# Patient Record
Sex: Female | Born: 1965 | Race: Black or African American | Hispanic: No | Marital: Married | Smoking: Never smoker
Health system: Southern US, Community
[De-identification: ages and names within clinical notes are randomized; demographics above are authoritative.]

## PROBLEM LIST (undated history)

## (undated) DIAGNOSIS — N912 Amenorrhea, unspecified: Secondary | ICD-10-CM

## (undated) DIAGNOSIS — N920 Excessive and frequent menstruation with regular cycle: Secondary | ICD-10-CM

## (undated) DIAGNOSIS — D649 Anemia, unspecified: Secondary | ICD-10-CM

## (undated) DIAGNOSIS — I10 Essential (primary) hypertension: Secondary | ICD-10-CM

## (undated) DIAGNOSIS — G43909 Migraine, unspecified, not intractable, without status migrainosus: Secondary | ICD-10-CM

## (undated) HISTORY — DX: Migraine, unspecified, not intractable, without status migrainosus: G43.909

## (undated) HISTORY — DX: Essential (primary) hypertension: I10

## (undated) HISTORY — DX: Amenorrhea, unspecified: N91.2

## (undated) HISTORY — DX: Excessive and frequent menstruation with regular cycle: N92.0

## (undated) HISTORY — DX: Anemia, unspecified: D64.9

---

## 2008-05-29 ENCOUNTER — Ambulatory Visit: Payer: Self-pay | Admitting: Internal Medicine

## 2008-06-06 ENCOUNTER — Emergency Department: Payer: Self-pay | Admitting: Emergency Medicine

## 2008-06-11 ENCOUNTER — Ambulatory Visit: Payer: Self-pay | Admitting: Internal Medicine

## 2008-06-29 ENCOUNTER — Ambulatory Visit: Payer: Self-pay | Admitting: Internal Medicine

## 2008-07-27 ENCOUNTER — Ambulatory Visit: Payer: Self-pay | Admitting: Internal Medicine

## 2008-08-27 ENCOUNTER — Ambulatory Visit: Payer: Self-pay | Admitting: Internal Medicine

## 2008-09-26 ENCOUNTER — Ambulatory Visit: Payer: Self-pay | Admitting: Internal Medicine

## 2008-10-27 ENCOUNTER — Ambulatory Visit: Payer: Self-pay | Admitting: Internal Medicine

## 2008-11-26 ENCOUNTER — Ambulatory Visit: Payer: Self-pay | Admitting: Internal Medicine

## 2008-12-27 ENCOUNTER — Ambulatory Visit: Payer: Self-pay | Admitting: Internal Medicine

## 2009-01-06 ENCOUNTER — Ambulatory Visit: Payer: Self-pay | Admitting: Internal Medicine

## 2009-01-27 ENCOUNTER — Ambulatory Visit: Payer: Self-pay | Admitting: Internal Medicine

## 2009-02-26 ENCOUNTER — Ambulatory Visit: Payer: Self-pay | Admitting: Internal Medicine

## 2009-03-17 ENCOUNTER — Ambulatory Visit: Payer: Self-pay | Admitting: Internal Medicine

## 2009-03-29 ENCOUNTER — Ambulatory Visit: Payer: Self-pay | Admitting: Internal Medicine

## 2009-04-28 ENCOUNTER — Ambulatory Visit: Payer: Self-pay | Admitting: Internal Medicine

## 2009-05-29 ENCOUNTER — Ambulatory Visit: Payer: Self-pay | Admitting: Internal Medicine

## 2009-05-29 HISTORY — PX: ENDOMETRIAL ABLATION: SHX621

## 2009-06-29 ENCOUNTER — Ambulatory Visit: Payer: Self-pay | Admitting: Internal Medicine

## 2009-07-15 ENCOUNTER — Ambulatory Visit: Payer: Self-pay | Admitting: Obstetrics & Gynecology

## 2009-07-16 ENCOUNTER — Encounter: Payer: Self-pay | Admitting: Obstetrics & Gynecology

## 2009-07-16 LAB — CONVERTED CEMR LAB
Clue Cells Wet Prep HPF POC: NONE SEEN
Trich, Wet Prep: NONE SEEN
WBC, Wet Prep HPF POC: NONE SEEN

## 2009-07-22 ENCOUNTER — Ambulatory Visit (HOSPITAL_COMMUNITY): Admission: RE | Admit: 2009-07-22 | Discharge: 2009-07-22 | Payer: Self-pay | Admitting: Obstetrics & Gynecology

## 2009-07-27 ENCOUNTER — Ambulatory Visit: Payer: Self-pay | Admitting: Internal Medicine

## 2009-08-02 ENCOUNTER — Ambulatory Visit: Payer: Self-pay | Admitting: Obstetrics & Gynecology

## 2009-08-16 ENCOUNTER — Ambulatory Visit (HOSPITAL_COMMUNITY): Admission: RE | Admit: 2009-08-16 | Discharge: 2009-08-16 | Payer: Self-pay | Admitting: Obstetrics & Gynecology

## 2009-08-16 ENCOUNTER — Ambulatory Visit: Payer: Self-pay | Admitting: Obstetrics & Gynecology

## 2009-08-27 ENCOUNTER — Ambulatory Visit: Payer: Self-pay | Admitting: Internal Medicine

## 2009-09-26 ENCOUNTER — Ambulatory Visit: Payer: Self-pay | Admitting: Internal Medicine

## 2009-09-27 ENCOUNTER — Ambulatory Visit: Payer: Self-pay | Admitting: Obstetrics & Gynecology

## 2009-09-27 LAB — CONVERTED CEMR LAB
HCT: 39 % (ref 36.0–46.0)
Hemoglobin: 11.3 g/dL — ABNORMAL LOW (ref 12.0–15.0)
MCHC: 29 g/dL — ABNORMAL LOW (ref 30.0–36.0)
MCV: 82.1 fL (ref 78.0–100.0)
Platelets: 367 10*3/uL (ref 150–400)
RBC: 4.75 M/uL (ref 3.87–5.11)
RDW: 17.4 % — ABNORMAL HIGH (ref 11.5–15.5)
TSH: 1.494 microintl units/mL (ref 0.350–4.500)
WBC: 4.7 10*3/uL (ref 4.0–10.5)

## 2009-10-27 ENCOUNTER — Ambulatory Visit: Payer: Self-pay | Admitting: Internal Medicine

## 2009-12-14 ENCOUNTER — Ambulatory Visit: Payer: Self-pay | Admitting: Gynecology

## 2009-12-14 ENCOUNTER — Inpatient Hospital Stay (HOSPITAL_COMMUNITY): Admission: AD | Admit: 2009-12-14 | Discharge: 2009-12-14 | Payer: Self-pay | Admitting: Obstetrics & Gynecology

## 2009-12-15 ENCOUNTER — Ambulatory Visit: Payer: Self-pay | Admitting: Obstetrics and Gynecology

## 2010-02-07 ENCOUNTER — Ambulatory Visit: Payer: Self-pay | Admitting: Obstetrics & Gynecology

## 2010-02-08 ENCOUNTER — Ambulatory Visit: Payer: Self-pay | Admitting: Obstetrics & Gynecology

## 2010-02-17 ENCOUNTER — Ambulatory Visit: Payer: Self-pay | Admitting: Obstetrics & Gynecology

## 2010-05-05 ENCOUNTER — Ambulatory Visit
Admission: RE | Admit: 2010-05-05 | Discharge: 2010-05-05 | Payer: Self-pay | Source: Home / Self Care | Attending: Obstetrics & Gynecology | Admitting: Obstetrics & Gynecology

## 2010-05-05 ENCOUNTER — Ambulatory Visit: Payer: Self-pay | Admitting: Obstetrics & Gynecology

## 2010-05-05 ENCOUNTER — Encounter: Payer: Self-pay | Admitting: Obstetrics & Gynecology

## 2010-05-11 ENCOUNTER — Ambulatory Visit: Payer: Self-pay | Admitting: Internal Medicine

## 2010-05-29 ENCOUNTER — Ambulatory Visit: Payer: Self-pay | Admitting: Internal Medicine

## 2010-05-29 HISTORY — PX: VAGINAL HYSTERECTOMY: SUR661

## 2010-05-29 HISTORY — PX: ENDOMETRIAL ABLATION: SHX621

## 2010-06-13 ENCOUNTER — Ambulatory Visit (HOSPITAL_COMMUNITY)
Admission: RE | Admit: 2010-06-13 | Discharge: 2010-06-14 | Payer: Self-pay | Source: Home / Self Care | Attending: Obstetrics & Gynecology | Admitting: Obstetrics & Gynecology

## 2010-06-13 ENCOUNTER — Encounter: Payer: Self-pay | Admitting: Obstetrics & Gynecology

## 2010-06-13 LAB — CBC
HCT: 40.5 % (ref 36.0–46.0)
Hemoglobin: 13.2 g/dL (ref 12.0–15.0)
MCH: 27.8 pg (ref 26.0–34.0)
MCHC: 32.6 g/dL (ref 30.0–36.0)
MCV: 85.4 fL (ref 78.0–100.0)
Platelets: 224 10*3/uL (ref 150–400)
RBC: 4.74 MIL/uL (ref 3.87–5.11)
RDW: 14.7 % (ref 11.5–15.5)
WBC: 5.9 10*3/uL (ref 4.0–10.5)

## 2010-06-13 LAB — SURGICAL PCR SCREEN
MRSA, PCR: NEGATIVE
Staphylococcus aureus: NEGATIVE

## 2010-06-15 LAB — CBC
HCT: 35.2 % — ABNORMAL LOW (ref 36.0–46.0)
Hemoglobin: 11.3 g/dL — ABNORMAL LOW (ref 12.0–15.0)
MCH: 27.4 pg (ref 26.0–34.0)
MCHC: 32.1 g/dL (ref 30.0–36.0)
MCV: 85.2 fL (ref 78.0–100.0)
Platelets: 260 10*3/uL (ref 150–400)
RBC: 4.13 MIL/uL (ref 3.87–5.11)
RDW: 14.5 % (ref 11.5–15.5)
WBC: 8.9 10*3/uL (ref 4.0–10.5)

## 2010-06-15 LAB — PREGNANCY, URINE: Preg Test, Ur: NEGATIVE

## 2010-06-23 NOTE — Op Note (Addendum)
Lori Levy, Lori Levy               ACCOUNT NO.:  [id number]  MEDICAL RECORD NO.:  [id number]          PATIENT TYPE:  OIB  LOCATION:  9312                          FACILITY:  [facility]  PHYSICIAN:  Allie Bossier, MD        DATE OF BIRTH:  05/23/1966  DATE OF PROCEDURE: DATE OF DISCHARGE:                              OPERATIVE REPORT   PREOPERATIVE DIAGNOSES:  Symptomatic uterine fibroids, menorrhagia, obesity.  POSTOPERATIVE DIAGNOSES:  Symptomatic uterine fibroids, menorrhagia, obesity, left ovarian simple cyst.  PROCEDURE:  Total vaginal hysterectomy, left ovarian cystotomy.  SURGEON:  Allie Bossier, MD  ASSISTANT:  Lesly Dukes, M.D.  ANESTHESIA:  GETA, Quillian Quince, M.D.  ESTIMATED BLOOD LOSS:  150 mL.  SPECIMENS:  Uterus with fibroids.  COMPLICATIONS:  None.  FINDINGS:  Fibroid uterus, normal-appearing ovaries.  DETAIL PROCEDURE AND FINDINGS:  The risks, benefits, and alternatives of surgery were explained, understood, and accepted.  She understands the risks including but not limited to infection, bleeding, damage to bowel, bladder, and ureters and deep vein thrombosis.  She wishes to proceed. Consents were signed.  All questions were answered.  In the operating room, her Ancef was given preoperatively.  She was placed in dorsal lithotomy position.  General anesthesia was applied without complication.  Her vagina and lower abdomen were prepped and draped in usual sterile fashion.  Her bladder was emptied with catheter.  Bimanual exam revealed an 8-week sized anteverted, mobile uterus with fibroids. Her adnexa were not enlarged.  A weighted speculum was placed posteriorly and a Deaver anteriorly.  Cervix was grasped with single- tooth tenaculum.  I created solution composed of 30 mL of 0.5% Marcaine with dilute epinephrine with 100 mL of injectable saline.  I used 80 mL of this solution to injecting it into the cervicovaginal junction for hydrodissection purposes.   I then made an incision with a #10 blade at the same site.  I entered the posterior peritoneum posteriorly and replaced the weighted speculum with a long weighted speculum.  I identified the anterior peritoneum and entered it sharply.  I then tagged and held it for further use.  The uterosacral ligament were clamped, cut, and ligated.  These were tagged and held for future use. Please note that 2-0 Vicryl suture was used throughout this case unless otherwise specified.  The uterine vessels were clamped, cut, and ligated.  Excellent hemostasis was noted.  At this point, I removed a posterior section of the uterus along with the fibroid to allow better visualization.  At this point, I was able to clamp, cut, and ligate the utero-ovarian vessels on each side.  Excellent hemostasis was noted. The uterus was removed and noted to be normal with the exception of the fibroids.  The ovary on the right appeared normal.  The one in the left had a 2-3 cm simple cyst.  I opened it with the Bovie and cauterized the edges.  Excellent hemostasis was noted.  I then placed a sponge on a stick in the vagina to keep the bowel back out of the operative site, and I closed the peritoneum  in a pursestring fashion with 2-0 Vicryl suture after assuring hemostasis of all pedicles.  I then used the sutures on the tagged uterosacral ligament to create angle sutures at each side of the vaginal cuff.  I then closed the vaginal cuff with a 0 Vicryl running locking suture in a vertical fashion.  Excellent hemostasis was noted.  Her Foley catheter drained clear urine.  She tolerated the procedure well.  Please note at the beginning of the case, the Foley catheter that had been placed was a latex free one and it was not in a position where I could clamp it easily, so I removed it and later replaced it with a normal Foley catheter.  Clear urine was noted. The instruments, sponge, and needle counts were correct.  She  tolerated the procedure well.  She was extubated and taken to recovery room in stable condition.     Allie Bossier, MD     MCD/MEDQ  D:  06/13/2010  T:  06/14/2010  Job:  [id number]  Electronically Signed by Nicholaus Bloom MD on 06/23/2010 03:48:55 PM

## 2010-06-23 NOTE — Discharge Summary (Addendum)
  Lori Levy, Lori Levy               ACCOUNT NO.:  [id number]  MEDICAL RECORD NO.:  [id number]          PATIENT TYPE:  OIB  LOCATION:  9312                          FACILITY:  [facility]  PHYSICIAN:  Allie Bossier, MD        DATE OF BIRTH:  [birth year]  DATE OF ADMISSION:  06/13/2010 DATE OF DISCHARGE:  06/14/2010                              DISCHARGE SUMMARY   ADMISSION DIAGNOSES: 1. Symptomatic fibroids. 2. Menorrhagia. 3. Dysfunctional uterine bleeding.  DISCHARGE DIAGNOSES: 1. Symptomatic fibroids. 2. Menorrhagia. 3. Dysfunctional uterine bleeding.  CONDITION:  Stable.  DISPOSITION:  Home.  DIET:  As tolerated.  Follow up in 3 weeks at the [facility] office or p.r.n. sooner.  MEDICATIONS: 1. Percocet 325/5 mg 1 p.o. q.4 hours p.r.n. pain, #30, no refills. 2. Ibuprofen 800 mg 1 p.o. q.8 hours p.r.n. 3. She is to discontinue her home med of Provera and it is okay with     me if she discontinue her folic acid that she was taking at home.  ACTIVITY:  She is not to drive a car while she is taking narcotics nor she did have anything in her vagina until 6 weeks postop.  BRIEF HISTORY OF PRESENT ILLNESS AND HOSPITAL COURSE:  Lori Levy is a 45 year old lady who has had symptomatic fibroids with menorrhagia, dysfunctional uterine bleeding.  Her hemoglobin got as low as 5 mg.  She had an endometrial ablation almost a year ago that she says has not helped.  Please note that her hemoglobin is currently 13.5.  She does have a fibroid on ultrasound and she wishes to have a hysterectomy, I thought the vaginal route was best.  She underwent an uncomplicated TVH with an EBL of 150 mL. Postoperatively, she has remained afebrile.  She is ambulating, tolerating p.o.  She is voiding and having flatus.  Her physical exam was within normal limits and her vital signs were stable.  She was afebrile and was discharged home and she will follow up as above.     Allie Bossier,  MD     MCD/MEDQ  D:  06/14/2010  T:  06/15/2010  Job:  [id number]  Electronically Signed by Nicholaus Bloom MD on 06/23/2010 03:48:52 PM

## 2010-06-28 ENCOUNTER — Ambulatory Visit
Admission: RE | Admit: 2010-06-28 | Discharge: 2010-06-28 | Payer: Self-pay | Source: Home / Self Care | Attending: Obstetrics & Gynecology | Admitting: Obstetrics & Gynecology

## 2010-06-29 NOTE — Assessment & Plan Note (Signed)
NAME:  MALON, SIDDALL NO.:  [id number]  MEDICAL RECORD NO.:  [id number]          PATIENT TYPE:  POB  LOCATION:  [facility] at [facility]         FACILITY:  [facility]  PHYSICIAN:  Allie Bossier, MD        DATE OF BIRTH:  16-Aug-1965  DATE OF SERVICE:  06/28/2010                                 CLINIC NOTE  Ms. Bovard is a 45 year old lady who underwent an uncomplicated TVH and left ovarian cystotomy on June 13, 2010.  She is now 2 weeks postop. She has no problems.  Her bowels are functioning normally.  Her bladder is functioning normally.  She has not had intercourse.  She has quit taking her Percocet and merely taking ibuprofen.  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  On exam, her abdomen is benign.  Her vaginal cuff is healing well.  ASSESSMENT/PLAN:  Postop 2 weeks, doing very well.  She said that she is fine to return to work but she knows not to have any intercourse until I see her back for her 6-week visit.     Allie Bossier, MD    MCD/MEDQ  D:  06/28/2010  T:  06/29/2010  Job:  [id number]

## 2010-07-21 IMAGING — US ABDOMEN ULTRASOUND
1 series · 17 of 25 positions shown · non-contrast
Comparison: none

REASON FOR EXAM: Vomiting, elevated LFT's
COMMENTS:

[Series 1: abdomen ultrasound · 17 of 61 slices shown]
[im 1/61]
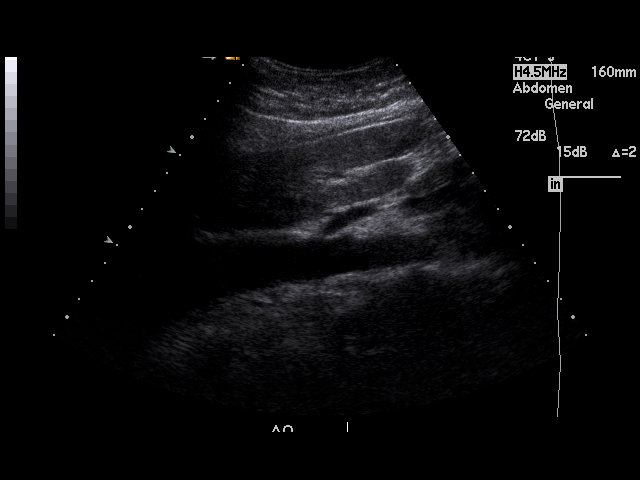
[im 6/61]
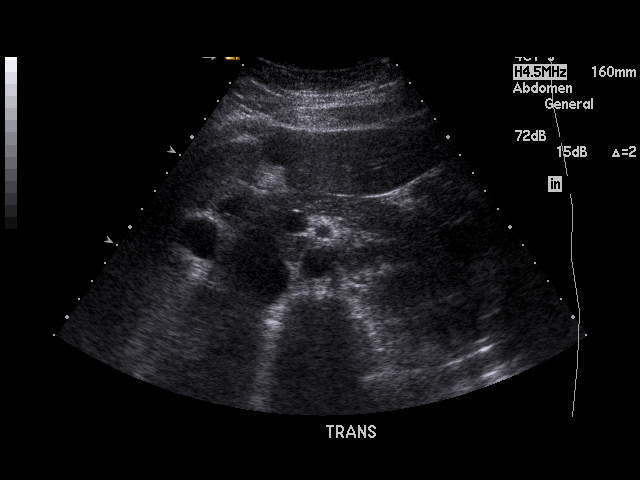
[im 8/61]
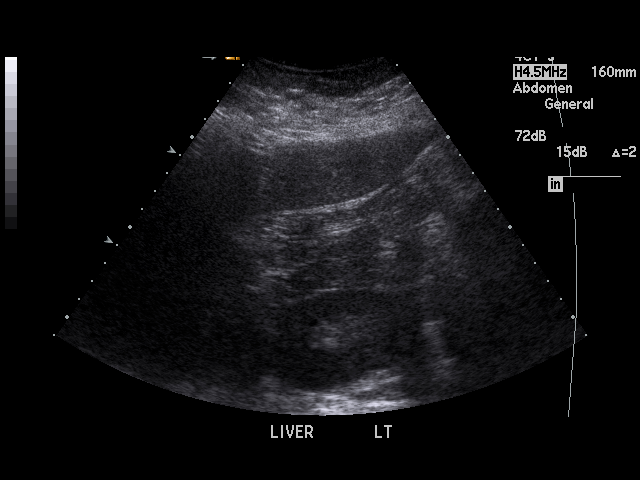
[im 13/61]
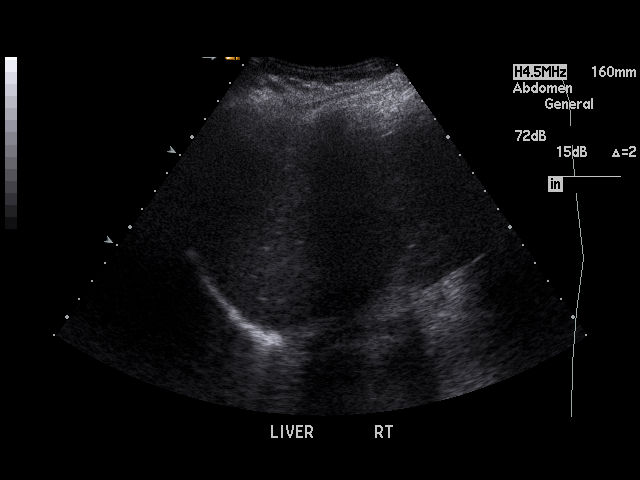
[im 16/61]
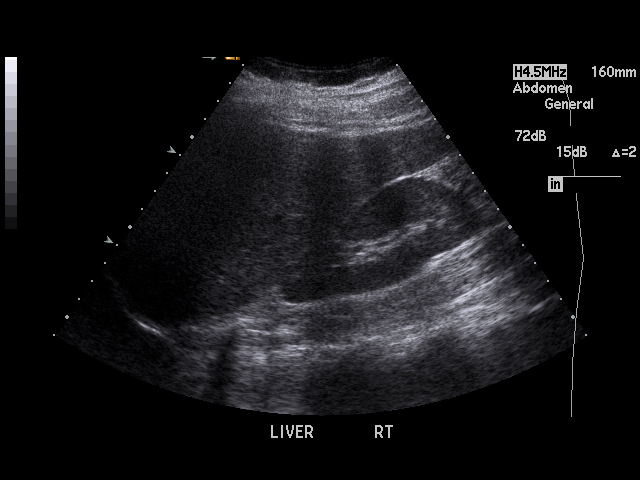
[im 21/61]
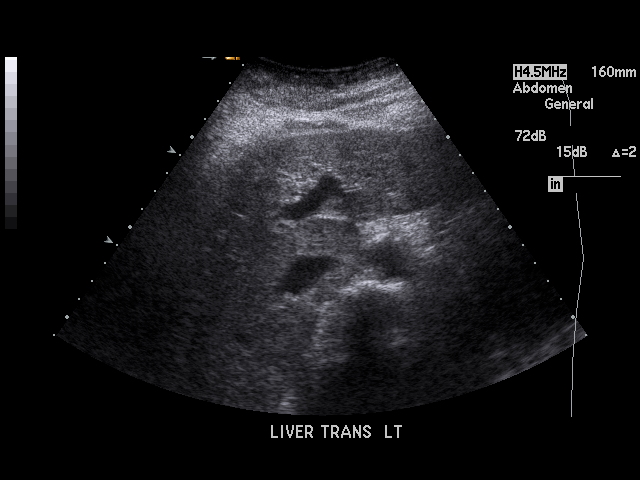
[im 23/61]
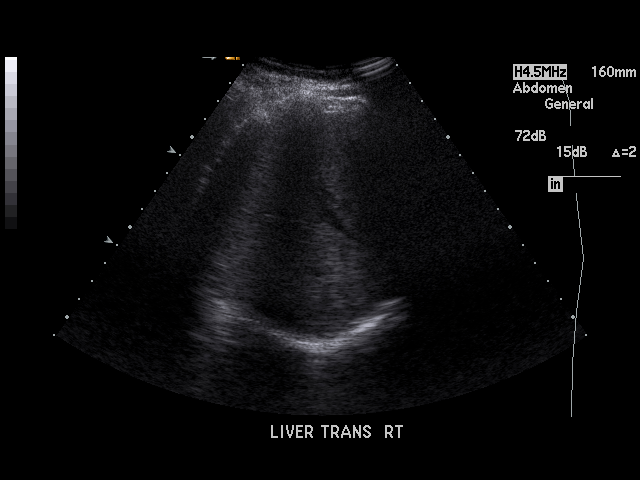
[im 28/61]
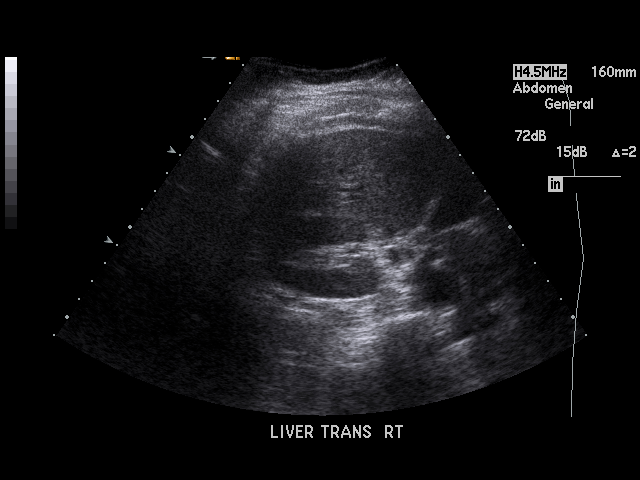
[im 31/61]
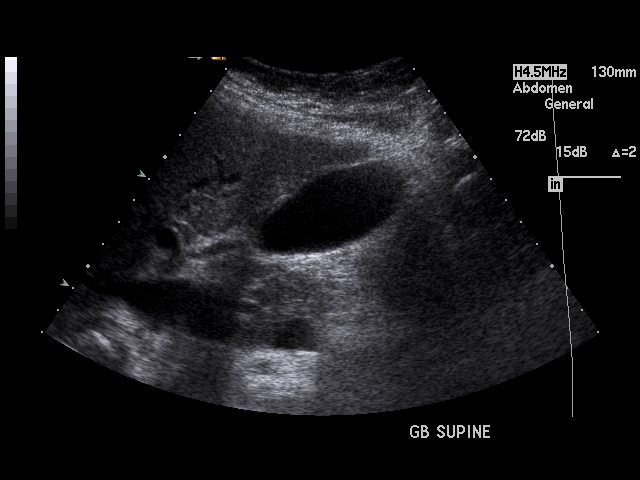
[im 33/61]
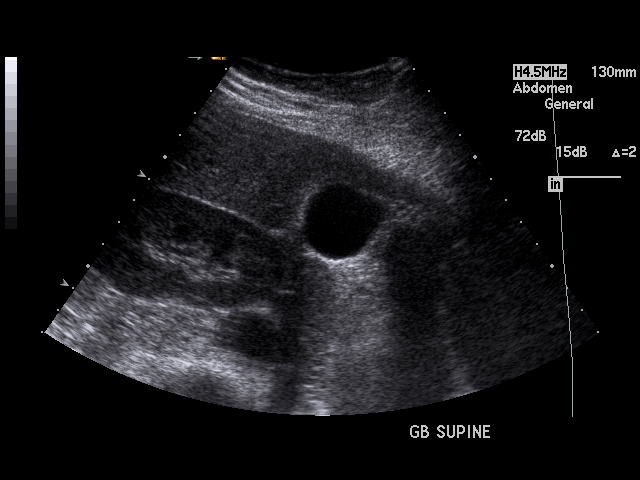
[im 38/61]
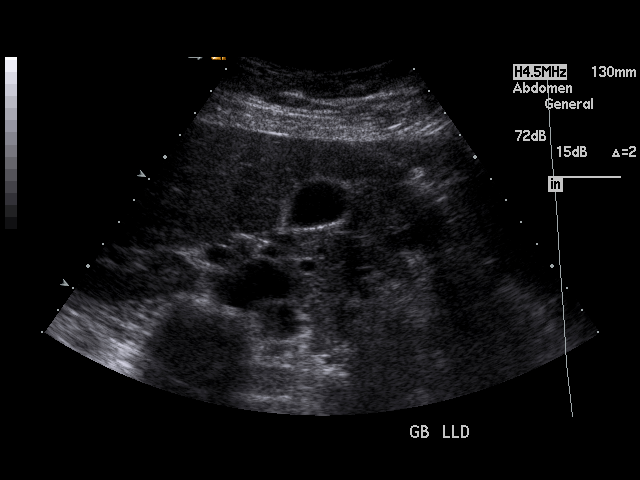
[im 41/61]
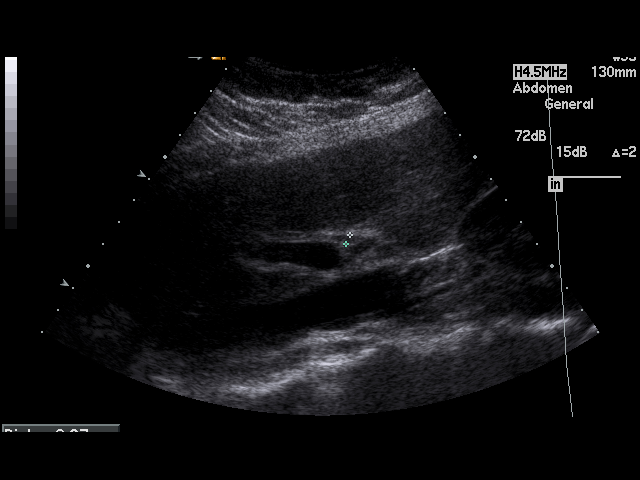
[im 46/61]
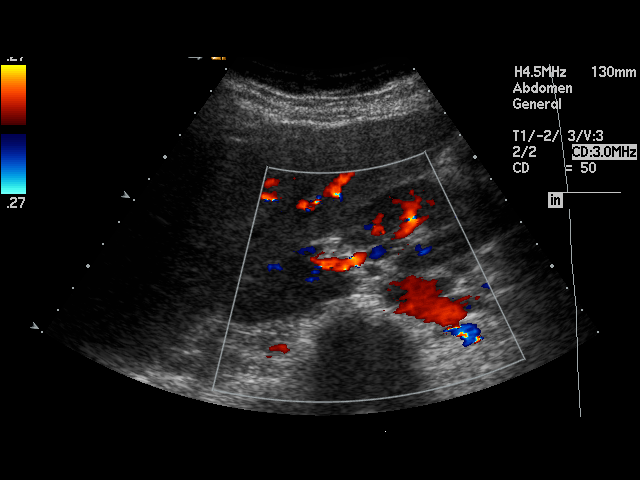
[im 48/61]
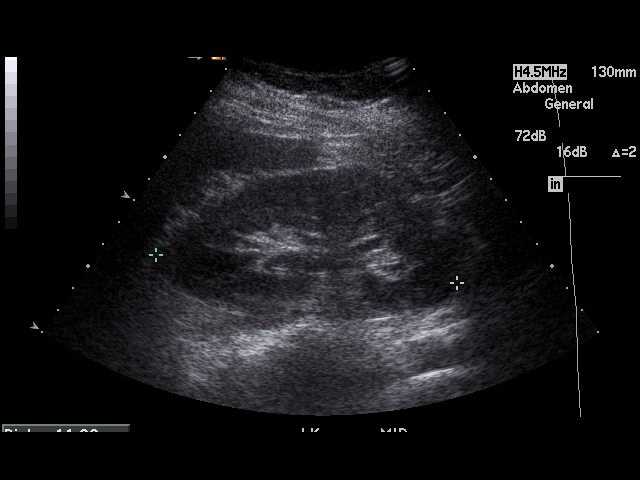
[im 53/61]
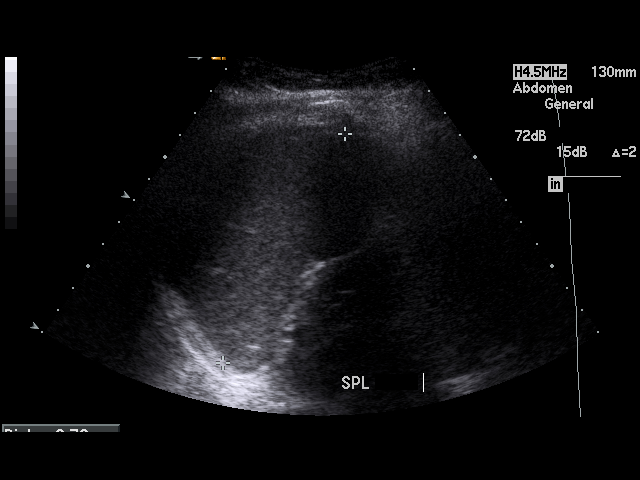
[im 56/61]
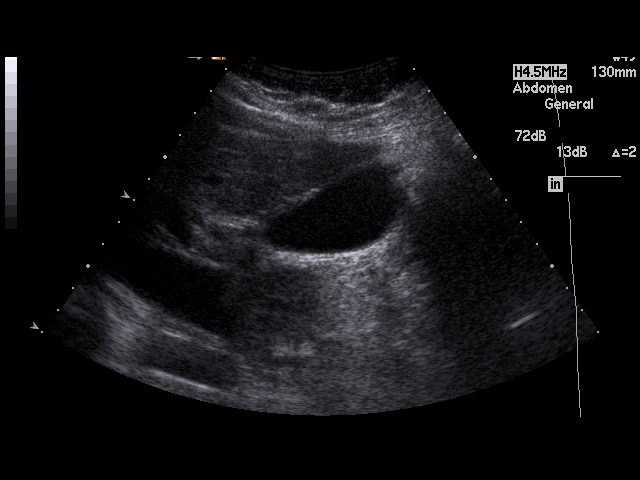
[im 61/61]
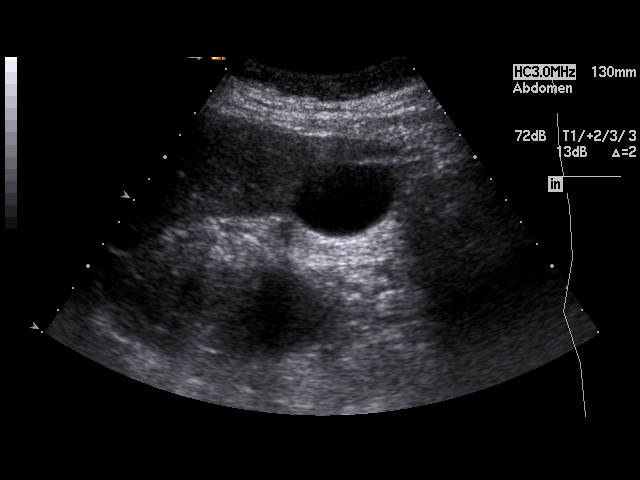

[17 of 25 positions shown; findings below may reference images not displayed]

PROCEDURE:     US  - US ABDOMEN GENERAL SURVEY  - June 06, 2008  [DATE]

RESULT:     Ultrasound of the abdomen demonstrates a normal appearance of
the abdominal aorta. The pancreas is unremarkable. The gallbladder is fluid
filled with no stones. The common bile duct diameter is 3.9 mm. The
gallbladder wall thickness is 1.9 mm. The liver, portal venous flow, kidneys
and spleen appear normal. There is no ascites.
IMPRESSION: Normal appearing Abdominal Ultrasound.

## 2010-07-26 ENCOUNTER — Ambulatory Visit: Payer: BC Managed Care – PPO | Admitting: Obstetrics and Gynecology

## 2010-07-26 DIAGNOSIS — Z09 Encounter for follow-up examination after completed treatment for conditions other than malignant neoplasm: Secondary | ICD-10-CM

## 2010-08-13 LAB — DIFFERENTIAL
Basophils Absolute: 0 10*3/uL (ref 0.0–0.1)
Basophils Relative: 0 % (ref 0–1)
Eosinophils Absolute: 0.1 10*3/uL (ref 0.0–0.7)
Eosinophils Relative: 1 % (ref 0–5)
Lymphocytes Relative: 24 % (ref 12–46)
Lymphs Abs: 1.5 10*3/uL (ref 0.7–4.0)
Monocytes Absolute: 0.6 10*3/uL (ref 0.1–1.0)
Monocytes Relative: 10 % (ref 3–12)
Neutro Abs: 4.1 10*3/uL (ref 1.7–7.7)
Neutrophils Relative %: 65 % (ref 43–77)

## 2010-08-13 LAB — CBC
HCT: 38 % (ref 36.0–46.0)
Hemoglobin: 12.1 g/dL (ref 12.0–15.0)
MCH: 27.1 pg (ref 26.0–34.0)
MCHC: 32 g/dL (ref 30.0–36.0)
MCV: 84.5 fL (ref 78.0–100.0)
Platelets: 188 10*3/uL (ref 150–400)
RBC: 4.49 MIL/uL (ref 3.87–5.11)
RDW: 17.5 % — ABNORMAL HIGH (ref 11.5–15.5)
WBC: 6.4 10*3/uL (ref 4.0–10.5)

## 2010-08-19 NOTE — Assessment & Plan Note (Signed)
NAME:  NIKESHA, KWASNY NO.:  [id number]  MEDICAL RECORD NO.:  [id number]           PATIENT TYPE:  LOCATION:  [facility] at [facility]           FACILITY:  PHYSICIAN:  Allie Bossier, MD        DATE OF BIRTH:  Jul 28, 1965  DATE OF SERVICE:  07/26/2010                                 CLINIC NOTE  Ms. Tramontana is a 45 year old lady who is now status post TVH, right ovarian cystotomy 6 weeks ago.  She has had no problems and her bowel and bladder function are entirely normal.  She is not taking any pain medicines.  She has not had sexual intercourse since surgery.  She would like to return to work without restrictions.  Physical exam, her vaginal cuff is well-healed.  Bimanual exam reveals no masses or tenderness.  ASSESSMENT/PLAN:  Postop 6 weeks doing well.  Please note her pathology was benign for fibroids, adenomyosis but no malignancy.  I have asked her to return to the office in 12 months for an annual exam or sooner if necessary.     Allie Bossier, MD    MCD/MEDQ  D:  07/26/2010  T:  07/26/2010  Job:  [id number]

## 2010-08-22 LAB — CBC
HCT: 38 % (ref 36.0–46.0)
Hemoglobin: 11.8 g/dL — ABNORMAL LOW (ref 12.0–15.0)
MCHC: 31.1 g/dL (ref 30.0–36.0)
MCV: 82.6 fL (ref 78.0–100.0)
Platelets: 227 10*3/uL (ref 150–400)
RBC: 4.6 MIL/uL (ref 3.87–5.11)
RDW: 18.7 % — ABNORMAL HIGH (ref 11.5–15.5)
WBC: 5.2 10*3/uL (ref 4.0–10.5)

## 2010-08-22 LAB — PREGNANCY, URINE: Preg Test, Ur: NEGATIVE

## 2010-10-11 NOTE — Assessment & Plan Note (Signed)
NAME:  Lori, Levy NO.:  [id number]   MEDICAL RECORD NO.:  [id number]          PATIENT TYPE:  POB   LOCATION:  [facility] at [facility]         FACILITY:  [facility]   PHYSICIAN:  Allie Bossier, MD        DATE OF BIRTH:  21-Jun-1965   DATE OF SERVICE:  07/15/2009                                  CLINIC NOTE   Ms. Lori Levy is a 45 year old married black gravida 3, para 3.  She has  children ages 19, 45, and 88.  They all are outside the home.  She comes  here because she has a long history of chronic anemia with her  hemoglobin majoring at 5.0 several months ago.  She has not required any  transfusions, but her hematologist gives her IV iron at times and it  seems that the direct cause of her bleeding is her heavy periods.  She  bleeds for about 8 days each month and it is very heavy.  She describes  she also has headaches with her periods.  She had a sudden ultrasound  several years ago that she describes as normal, at [facility] but  no ultrasound recently.  She says her TSH was checked recently by Dr.  Kerin Salen, her hematologist, who says that was normal.  She has been tried  on Mirena (over a year), oral contraceptive pills, all to no avail.   REVIEW OF SYSTEMS:  She is married for 23 years.  Her husband is status  post vasectomy.  She is a Veterinary surgeon at [employer].  She has not had  any transfusions, just looks chronically low.  Her last mammogram and  Pap smear were done in 2009.   FAMILY HISTORY:  Negative for breast, GYN and colon malignancies.   ALLERGIES TO MEDICINES:  LATEX.   SOCIAL HISTORY:  She denies tobacco or drug abuse.  She does drink  socially.   MEDICATIONS:  She takes folic acid.  She takes IV iron periodically.  She takes Percocet for headaches on a p.r.n. basis and she says she  takes the Diflucan and over-the-counter yeast medications after her  periods for a presumed yeast infection.   Physical exam; well-nourished, well-hydrated  pleasant white female,  height 5 feet 5-1/2 inches, weight 187, blood pressure 118/84, pulse 79.  HEENT, normal breast exam.  She does have fibrocystic changes,  especially just below the nipple on each side.  These are symmetrical.  She says they get much bigger during her period, but they regress in  size at the completion of her periods.  She does her self-breast exams.  Abdomen; obese, benign.  External genitalia, no lesions.  Cervix parous.  She has a pubic arch that would allow easy vaginal approach for  hysterectomy.  Uterus is upper limits of normal size.  It is mobile and  anteverted.  Her adnexa are nonenlarged and nontender.   ASSESSMENT AND PLAN:  1. Annual exam.  I have checked Pap smear and we will schedule a      mammogram.  Recommended self-breast and self-vulvar exams.  2. Menorrhagia with resulting chronic anemia.  I will await her  ultrasound result.  I have discussed endometrial ablation as well      as a vaginal hysterectomy.  I told her that we should get the      results of her ultrasound prior to      making decisions with regard to her possible yeast infections after      period.  I have checked a wet prep today and we will follow up on      that.      Allie Bossier, MD     MCD/MEDQ  D:  07/15/2009  T:  07/15/2009  Job:  [id number]

## 2010-10-11 NOTE — Assessment & Plan Note (Signed)
NAME:  Lori Levy, Lori Levy NO.:  [id number]   MEDICAL RECORD NO.:  [id number]          PATIENT TYPE:  POB   LOCATION:  [facility] at [facility]         FACILITY:  [facility]   PHYSICIAN:  Jaynie Collins, MD     DATE OF BIRTH:  [birth year]   DATE OF SERVICE:  05/05/2010                                  CLINIC NOTE   REASON FOR VISIT:  Left lower extremity cramping and oral contraceptive  pill surveillance.   The patient is a 45 year old gravida 3, para 3 who is on oral  contraceptive pills for menorrhagia.  The patient had an endometrial  ablation in March 2011 but has continued to have bleeding and was  counseled regarding her being on birth control pills versus Mirena  versus surgery.  The patient opted to be on Beyaz but did schedule a  vaginal hysterectomy on January 16.  She has been on different oral  contraceptive pills over the years; however, she has noted since the  last 1 or 2 weeks that she has had increasingly worsening left lower  extremity cramping.  The patient says that this is worse at night and  she denies any right lower extremity symptoms.  The patient is very  concerned and wants further evaluation.   PHYSICAL EXAMINATION:  VITAL SIGNS:  Blood pressure is 124/90, pulse 88,  weight 187 pounds, height 5 feet 6-1/2 inches.  GENERAL:  No apparent distress.  LUNGS:  Clear to auscultation bilaterally.  EXTREMITIES:  The patient does have some tenderness on palpation of her  left calf area, but no cords are palpated.  She does have a positive  Homans' sign, but is very tender no matter where you touch on her left  lower extremity.  There is no edema or cyanosis or clubbing, and her  calf measures about 40 cm in the largest dimension.  On evaluation of  her right leg, there is no tenderness elicited on examination.  Negative  Homans' sign.  No cords palpated and her calf measured the same, which  is about 40 cm in the largest dimension.  No edema bilaterally.   IMPRESSION:  The patient on oral contraceptive pills, now with left  lower extremity cramping and pain.   PLAN:  Given that the patient is on oral contraceptive pills, especially  a pill that is containing drospirenone (Beyaz), the patient will be  scheduled for bilateral lower extremity Dopplers, especially evaluation  of her left lower extremity Doppler to rule out DVT.  In the meantime,  she was told to discontinue the [facility] until further notice and if she  does start to have some withdrawal bleeding prior to surgery, she was  given a prescription for Provera 10 mg twice a day to use.  We will  follow up on the results of her lower extremity Dopplers and manage  accordingly.           ______________________________  Jaynie Collins, MD     UA/MEDQ  D:  05/05/2010  T:  05/05/2010  Job:  [id number]

## 2010-10-11 NOTE — Assessment & Plan Note (Signed)
NAME:  Lori Levy, IANNACCONE NO.:  [id number]   MEDICAL RECORD NO.:  [id number]          PATIENT TYPE:  POB   LOCATION:  [facility] at [facility]         FACILITY:  [facility]   PHYSICIAN:  Allie Bossier, MD        DATE OF BIRTH:  15-May-1966   DATE OF SERVICE:  09/27/2009                                  CLINIC NOTE   Ms. Emilee Hero is a 45 year old married black gravida 3, para 3 who is now 5-  1/2 weeks postop status post HTA, hydrothermal ablation and D and C.  Her pathology showed normal endometrium.  She reports that she had 3  days worth of spotting last week and was associated with dysmenorrhea  and some bloating.  The bloating and pain is now resolved.  She tried  her prescription diclofenac for relief of the pain, but it did not help  much.  On exam, her cervix shows no abnormalities or bleeding.   ASSESSMENT AND PLAN:  1. 5 and 1/2 weeks status post hydrothermal ablation and dilation and      curettage.  Reassurance given.  2. A 20-pound weight gain in the last year.  I will check TSH today.  3. Her primary doctor still encourages just to take iron and would      like her hemoglobin to be at a steady 13 before she would      recommend discontinuing her iron, so I am checking a CBC today.      Allie Bossier, MD     MCD/MEDQ  D:  09/27/2009  T:  09/28/2009  Job:  [id number]

## 2010-10-11 NOTE — Assessment & Plan Note (Signed)
NAME:  AMYJO, MIZRACHI NO.:  [id number]   MEDICAL RECORD NO.:  [id number]          PATIENT TYPE:  POB   LOCATION:  [facility] at [facility]         FACILITY:  [facility]   PHYSICIAN:  Allie Bossier, MD        DATE OF BIRTH:  1965/06/10   DATE OF SERVICE:  02/08/2010                                  CLINIC NOTE   Ms. Paul is a 45 year old married black G3, P3 who is status post  endometrial ablation in March 2011.  She was seen in the MAU in December 14, 2009, for heavy vaginal bleeding.  At that time, her hemoglobin was 12.  Please note that preoperative it with 7; however, she says she had a  heavy period in July, she had another heavy period in August, and then  she just started another period this month.  Please note, the ultrasound  was done in July showed that her 2 cm fibroid is now 2.6 cm and her  endometrial lining is thin.  Her ovaries appeared normal.  I discussed  with her medical options including a pill and Mirena versus surgical  options including a vaginal hysterectomy.  She would like to have a  vaginal hysterectomy but cannot do this until December; therefore, I  have given her samples of 2 months of Beyaz and hopefully, this will  decrease her periods enough that she will be satisfied until the time  when her hysterectomy can be performed.  In fact, if the birth control  pills control her symptoms adequately, she would not have to have a  hysterectomy at all.  She will come back in next week or 2 for blood  pressure check or sooner as necessary.      Allie Bossier, MD     MCD/MEDQ  D:  02/08/2010  T:  02/09/2010  Job:  [id number]

## 2010-10-11 NOTE — Assessment & Plan Note (Signed)
NAME:  JANECIA, PALAU NO.:  [id number]   MEDICAL RECORD NO.:  [id number]          PATIENT TYPE:  POB   LOCATION:  [facility] at [facility]         FACILITY:  [facility]   PHYSICIAN:  Allie Bossier, MD        DATE OF BIRTH:  Dec 29, 1965   DATE OF SERVICE:  08/02/2009                                  CLINIC NOTE   Ms. Brinkley is a 45 year old married black, gravida 3, para 3 with a  history of significant menorrhagia with resulting chronic anemia that  requires IV iron transfusions for the last 2 years.  An ultrasound that  I ordered last week shows 2 uterine fibroids, the largest measuring 3.2  cm has a submucosal component.  I discussed treatment options including  a endometrial ablation and a vaginal hysterectomy.  She opts to proceed  with the ablation.  I will schedule an ablation and D and C to be done  in the very near future.  CBC will be checked prior to surgery.      Allie Bossier, MD     MCD/MEDQ  D:  08/02/2009  T:  08/03/2009  Job:  [id number]

## 2010-10-11 NOTE — Assessment & Plan Note (Signed)
Lori Levy, Lori Levy NO.:  [id number]   MEDICAL RECORD NO.:  [id number]          PATIENT TYPE:  POB   LOCATION:  [facility] at [facility]         FACILITY:  [facility]   PHYSICIAN:  Catalina Antigua, MD     DATE OF BIRTH:  05-Nov-1965   DATE OF SERVICE:  12/15/2009                                  CLINIC NOTE   This is a 45 year old G3, P3, who is status post endometrial ablation in  March 2011, who comes today as a MAU followup for heavy vaginal  bleeding.  The patient states that on Monday, she started experiencing  some cramping pain, a little bit more than she is used to, but she was  able to tolerate it; but yesterday around lunchtime, she started  experiencing some heavy cramping and noted some heavy vaginal bleeding.  The patient states that she soaked through 6 pads from noon until her  presentation to the MAU around 5 and another 6 pads while she was there  at MAU during her 3-hour stay.  The patient states that today her  bleeding is minimal, rarely spotting.  She denies any cramping pain at  this point.  Denies any chest pain, shortness of breath,  lightheadedness, dizziness, or palpitations.   PHYSICAL EXAMINATION:  VITAL SIGNS:  Her blood pressure is 126/80, pulse  of 87, weight of 188 pounds, height of 65-1/2 inches.  ABDOMEN:  Soft, nontender, nondistended.  PELVIC:  She had minimal bleeding.   An ultrasound that was performed on December 14, 2009, in the MAU was  essentially unchanged from her February 2011 ultrasound which  demonstrated an uterus measuring 10 x 7 x 6 cm with an endometrial  stripe of 0.8 cm, normal right and left ovaries, and she has a small  left fibroid, measuring 2 x 2 x 2 cm.   ASSESSMENT AND PLAN:  This is a 45 year old G3, P3, who is status post  endometrial ablation in March 2011, who presents status post MAU  evaluation for heavy vaginal bleeding.  The patient was advised that it  was expected to experience some abnormal bleeding  status post an  ablation and since this is a one-time occurrence since the procedure, no  further monitoring is recommended.  Discussion of future interventions  were reviewed which included initiation of hormonal birth control or  Mirena IUD versus repeat endometrial ablation versus definitive surgical  treatment with hysterectomy were discussed.  In the meantime, a  prescription for Provera was given in the event that the bleeding was to  recur until the patient is able to schedule a return office visit for  further management.  The patient is otherwise due for annual exam in  February.  The patient is told to return prior to February if she  develops any further gynecologic issues.           ______________________________  Catalina Antigua, MD     PC/MEDQ  D:  12/15/2009  T:  12/16/2009  Job:  [id number]

## 2010-12-07 ENCOUNTER — Telehealth: Payer: Self-pay

## 2010-12-07 NOTE — Telephone Encounter (Signed)
Patient has had some menopausal symptoms since her surgery. She is wondering if surgery could of caused it to come sooner or if she is just early menopausal. She would like to speak to a nurse today if all possible please call her on her cell. Thanks!

## 2011-03-10 ENCOUNTER — Other Ambulatory Visit: Payer: Self-pay | Admitting: Obstetrics & Gynecology

## 2011-05-15 ENCOUNTER — Ambulatory Visit: Payer: BC Managed Care – PPO | Admitting: Obstetrics & Gynecology

## 2011-06-05 ENCOUNTER — Ambulatory Visit: Payer: BC Managed Care – PPO | Admitting: Obstetrics & Gynecology

## 2011-06-26 ENCOUNTER — Ambulatory Visit: Payer: BC Managed Care – PPO | Admitting: Obstetrics & Gynecology

## 2011-07-06 ENCOUNTER — Ambulatory Visit (INDEPENDENT_AMBULATORY_CARE_PROVIDER_SITE_OTHER): Payer: BC Managed Care – PPO | Admitting: Obstetrics & Gynecology

## 2011-07-06 ENCOUNTER — Encounter: Payer: Self-pay | Admitting: Obstetrics & Gynecology

## 2011-07-06 VITALS — BP 115/82 | HR 96 | Ht 65.5 in | Wt 189.2 lb

## 2011-07-06 DIAGNOSIS — Z01419 Encounter for gynecological examination (general) (routine) without abnormal findings: Secondary | ICD-10-CM

## 2011-07-06 NOTE — Progress Notes (Signed)
Subjective:    Lori Levy is a 46 y.o. female who presents for an annual exam. She complains of night sweats and hot flashes for several months. Her mother went through menopause at 30 yo and her maternal aunt at 41 yo.The patient is sexually active. GYN screening history: last pap: was normal. The patient wears seatbelts: yes. The patient participates in regular exercise: no. Has the patient ever been transfused or tattooed?: no. The patient reports that there is not domestic violence in her life.   Menstrual History: OB History    Grav Para Term Preterm Abortions TAB SAB Ect Mult Living   3 3              Menarche age: 66 No LMP recorded.    The following portions of the patient's history were reviewed and updated as appropriate: allergies, current medications, past family history, past medical history, past social history, past surgical history and problem list.  Review of Systems A comprehensive review of systems was negative. She is sexually active and denies dysparunia., married for 24 years, her kids are 78,22, 23 yo. 1 grandson (48 yo), works at [employer]   Objective:    BP 115/82  Pulse 96  Ht 5' 5.5" (1.664 m)  Wt 189 lb 4 oz (85.843 kg)  BMI 31.01 kg/m2  General Appearance:    Alert, cooperative, no distress, appears stated age  Head:    Normocephalic, without obvious abnormality, atraumatic  Eyes:    PERRL, conjunctiva/corneas clear, EOM's intact, fundi    benign, both eyes  Ears:    Normal TM's and external ear canals, both ears  Nose:   Nares normal, septum midline, mucosa normal, no drainage    or sinus tenderness  Throat:   Lips, mucosa, and tongue normal; teeth and gums normal  Neck:   Supple, symmetrical, trachea midline, no adenopathy;    thyroid:  no enlargement/tenderness/nodules; no carotid   bruit or JVD  Back:     Symmetric, no curvature, ROM normal, no CVA tenderness  Lungs:     Clear to auscultation bilaterally, respirations unlabored    Chest Wall:    No tenderness or deformity   Heart:    Regular rate and rhythm, S1 and S2 normal, no murmur, rub   or gallop  Breast Exam:    No tenderness, masses, or nipple abnormality, fibrocystic (cyclic per patient) in right breast lower outer quadrant  Abdomen:     Soft, non-tender, bowel sounds active all four quadrants,    no masses, no organomegaly  Genitalia:    Normal female without lesion, discharge or tenderness, no vulvar lesions, normal bimanual exam, no masses     Extremities:   Extremities normal, atraumatic, no cyanosis or edema  Pulses:   2+ and symmetric all extremities  Skin:   Skin color, texture, turgor normal, no rashes or lesions  Lymph nodes:   Cervical, supraclavicular, and axillary nodes normal  Neurologic:   CNII-XII intact, normal strength, sensation and reflexes    throughout  .    Assessment:    Healthy female exam.    Plan:     Mammogram.  labs

## 2011-07-07 LAB — CBC
HCT: 41.7 % (ref 36.0–46.0)
Hemoglobin: 13.3 g/dL (ref 12.0–15.0)
MCH: 27.9 pg (ref 26.0–34.0)
MCHC: 31.9 g/dL (ref 30.0–36.0)
MCV: 87.6 fL (ref 78.0–100.0)
Platelets: 289 10*3/uL (ref 150–400)
RBC: 4.76 MIL/uL (ref 3.87–5.11)
RDW: 14.6 % (ref 11.5–15.5)
WBC: 6.6 10*3/uL (ref 4.0–10.5)

## 2011-07-07 LAB — COMPREHENSIVE METABOLIC PANEL
ALT: 11 U/L (ref 0–35)
AST: 15 U/L (ref 0–37)
Albumin: 4.2 g/dL (ref 3.5–5.2)
Alkaline Phosphatase: 76 U/L (ref 39–117)
BUN: 13 mg/dL (ref 6–23)
CO2: 25 mEq/L (ref 19–32)
Calcium: 9.1 mg/dL (ref 8.4–10.5)
Chloride: 105 mEq/L (ref 96–112)
Creat: 0.78 mg/dL (ref 0.50–1.10)
Glucose, Bld: 107 mg/dL — ABNORMAL HIGH (ref 70–99)
Potassium: 3.9 mEq/L (ref 3.5–5.3)
Sodium: 140 mEq/L (ref 135–145)
Total Bilirubin: 0.6 mg/dL (ref 0.3–1.2)
Total Protein: 7.5 g/dL (ref 6.0–8.3)

## 2011-07-07 LAB — LIPID PANEL
Cholesterol: 180 mg/dL (ref 0–200)
HDL: 61 mg/dL (ref >39)
LDL Cholesterol: 108 mg/dL — ABNORMAL HIGH (ref 0–99)
Total CHOL/HDL Ratio: 3 Ratio
Triglycerides: 55 mg/dL (ref <150)
VLDL: 11 mg/dL (ref 0–40)

## 2011-07-07 LAB — FOLLICLE STIMULATING HORMONE: FSH: 1.7 m[IU]/mL

## 2011-07-07 LAB — TSH: TSH: 0.809 u[IU]/mL (ref 0.350–4.500)

## 2011-08-31 ENCOUNTER — Other Ambulatory Visit: Payer: Self-pay | Admitting: Obstetrics & Gynecology

## 2011-08-31 ENCOUNTER — Other Ambulatory Visit: Payer: Self-pay | Admitting: Gynecology

## 2011-08-31 DIAGNOSIS — B373 Candidiasis of vulva and vagina: Secondary | ICD-10-CM

## 2011-08-31 MED ORDER — FLUCONAZOLE 150 MG PO TABS: 150.0000 mg | ORAL_TABLET | Freq: Once | ORAL | Status: DC

## 2011-09-05 IMAGING — US US TRANSVAGINAL NON-OB
1 series · 14 of 25 positions shown · non-contrast
Comparison: None.

CLINICAL DATA: Menorrhagia.  Anemia.

TRANSABDOMINAL AND TRANSVAGINAL ULTRASOUND OF PELVIS
TECHNIQUE: Both transabdominal and transvaginal ultrasound
examinations of the pelvis were performed including evaluation of
the uterus, ovaries, adnexal regions, and pelvic cul-de-sac.

[Series 1: us transvaginal non-ob · 0.19mm/px · 67 acquisitions, 14 frames shown]
[im 1/67]
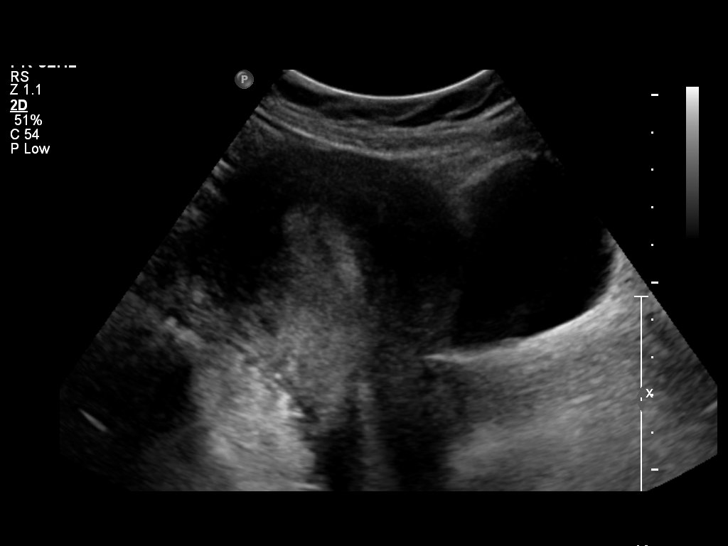
[im 6/67]
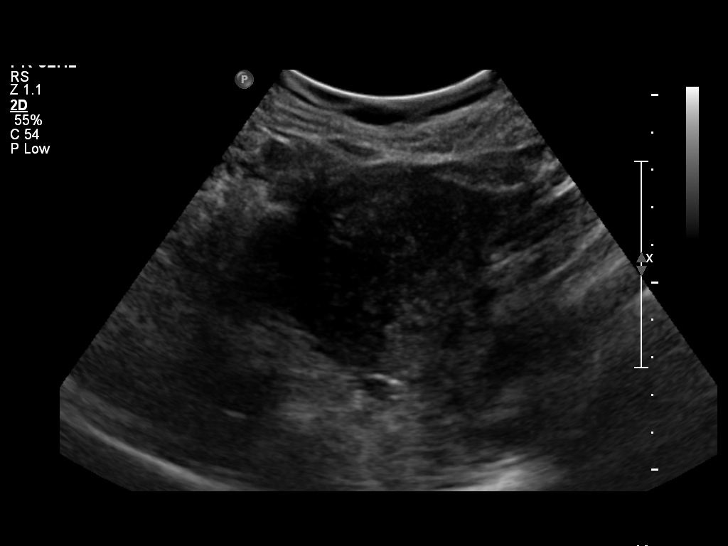
[im 12/67]
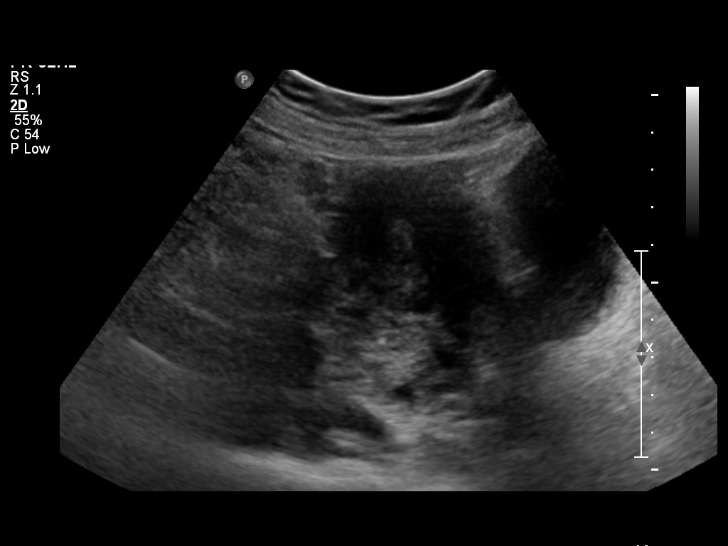
[im 17/67]
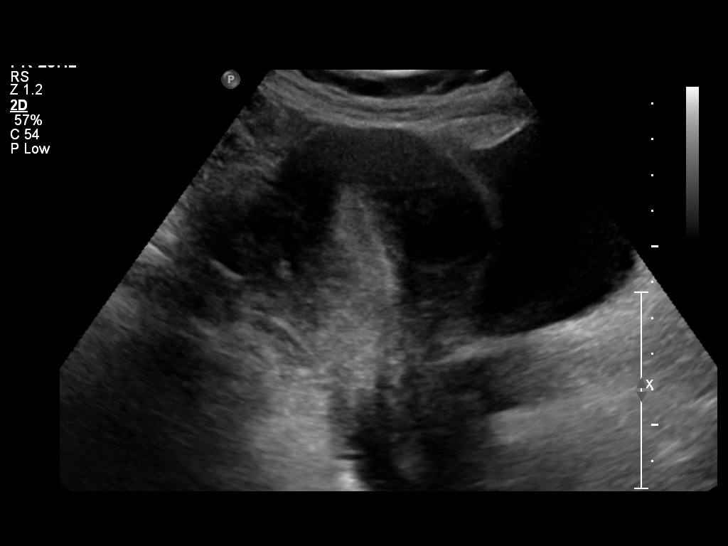
[im 23/67]
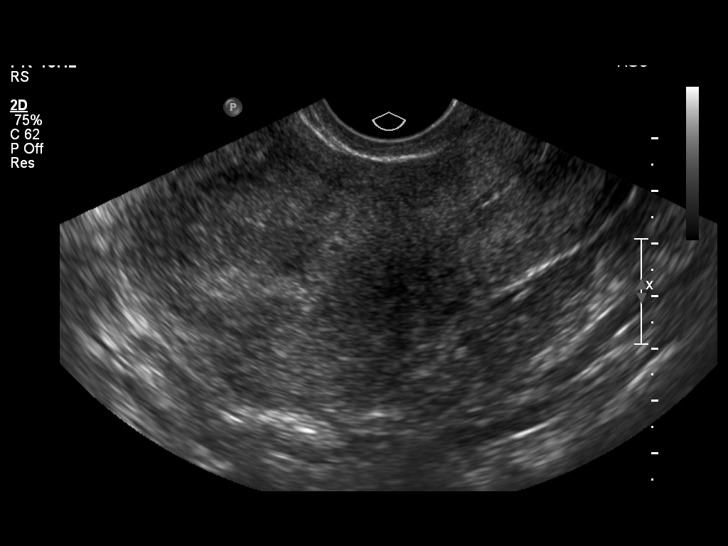
[im 25/67]
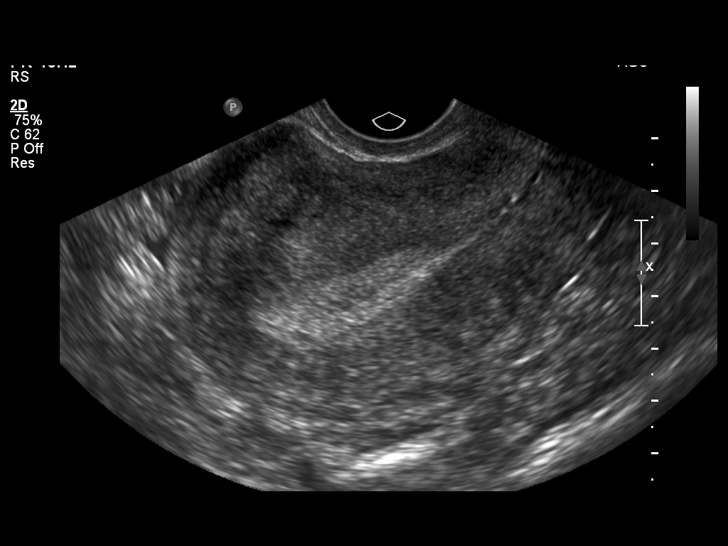
[im 31/67]
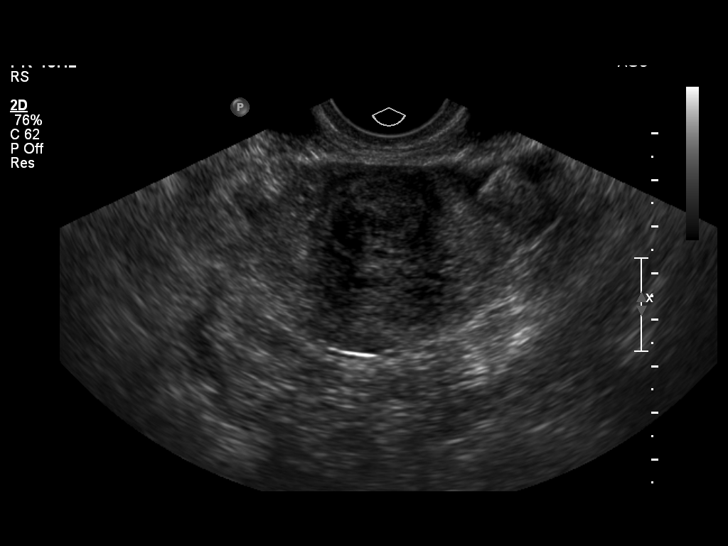
[im 36/67]
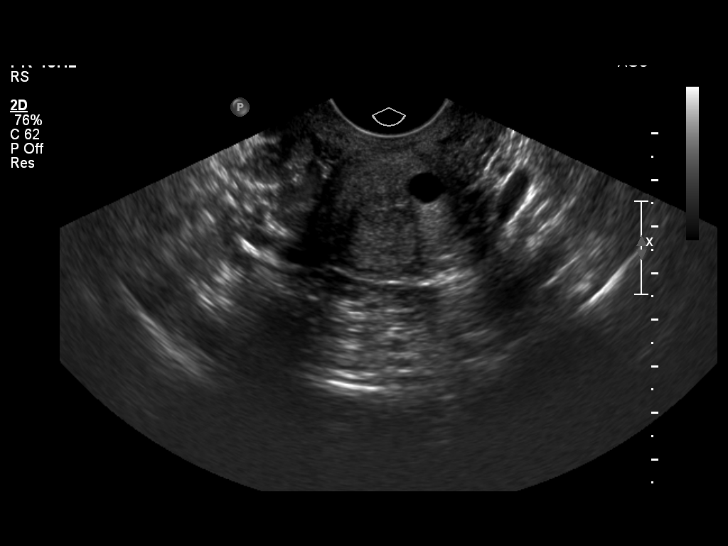
[im 42/67]
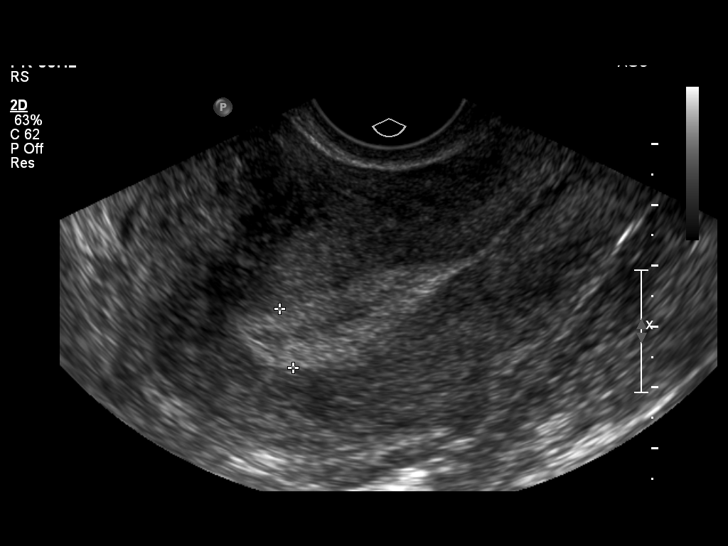
[im 45/67]
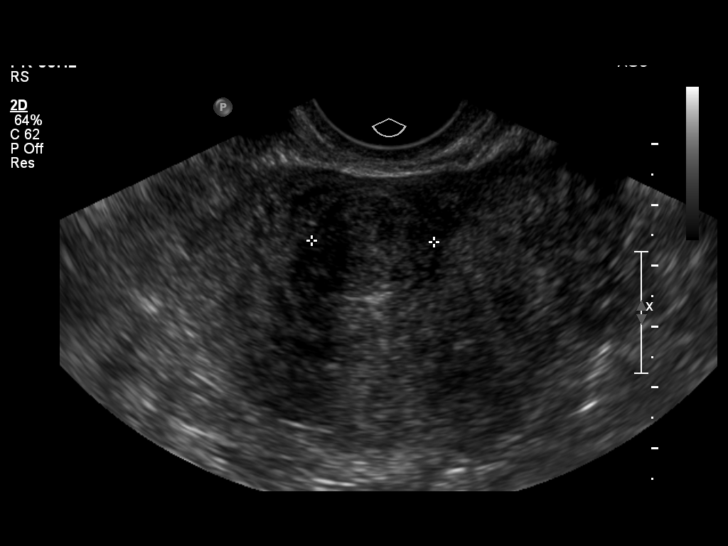
[im 50/67]
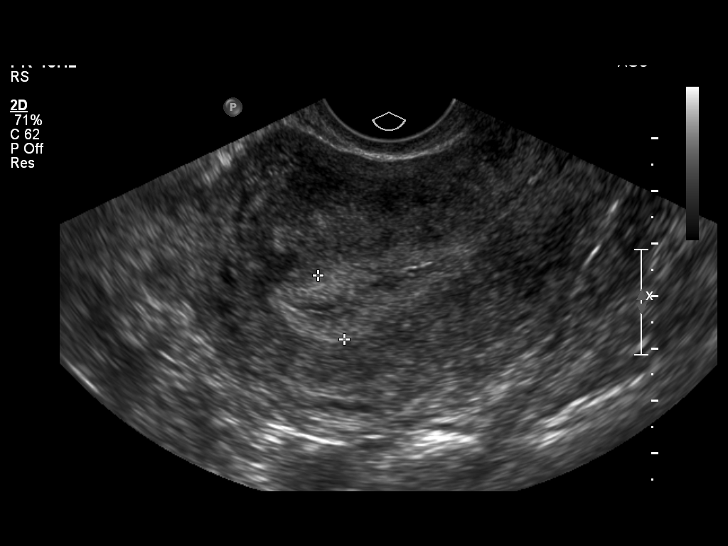
[im 56/67]
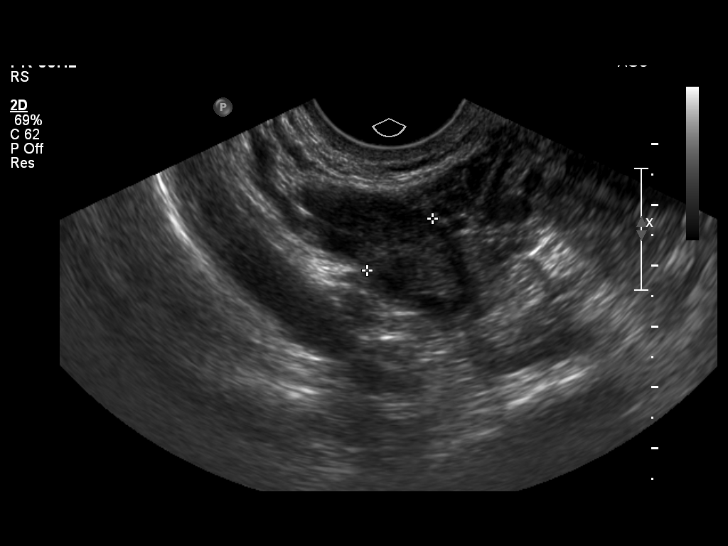
[im 61/67]
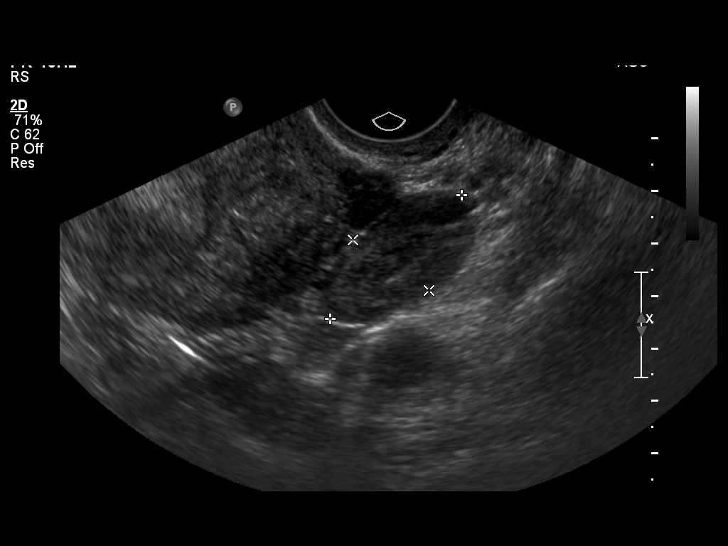
[im 67/67]
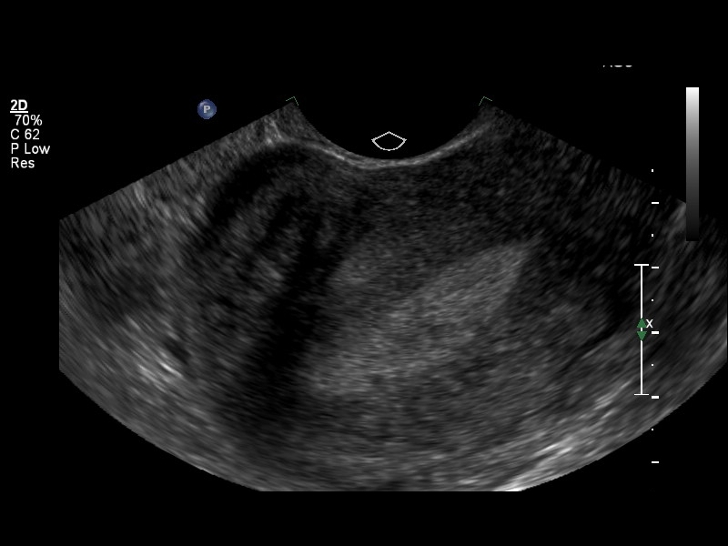

[14 of 25 positions shown; findings below may reference images not displayed]

FINDINGS: Uterus measures 9.8 x 6.3 x 7.9 cm. An intramural fibroid is seen
in the uterine fundus measuring 2.1 cm in maximal diameter.  A
second fibroid with a partial submucosal component is seen in the
left uterine body measuring 3.2 cm in maximum diameter.

Endometrium measures 13 mm in thickness.  Within normal limits in
appearance.

Right Ovary measures 3.4 x 1.6 x 1.4 cm. Normal appearance.

Left Ovary measures 3.4 x 1.7 x 1.7 cm.  Normal appearance.

Other Findings:  No adnexal mass or free fluid identified.
IMPRESSION: 1.  Two uterine fibroids, with largest measuring 3.2 cm and having
a partial submucosal component.
2.  Normal ovaries.

## 2011-11-09 ENCOUNTER — Other Ambulatory Visit: Payer: Self-pay | Admitting: Obstetrics & Gynecology

## 2011-12-04 ENCOUNTER — Telehealth: Payer: Self-pay | Admitting: *Deleted

## 2011-12-04 DIAGNOSIS — B373 Candidiasis of vulva and vagina: Secondary | ICD-10-CM

## 2011-12-04 MED ORDER — FLUCONAZOLE 150 MG PO TABS: 150.0000 mg | ORAL_TABLET | Freq: Once | ORAL | Status: DC

## 2011-12-04 NOTE — Telephone Encounter (Signed)
Pharmacy did not have refills of diflucan and patient needs these called in.

## 2011-12-18 ENCOUNTER — Encounter: Payer: Self-pay | Admitting: Obstetrics & Gynecology

## 2011-12-18 ENCOUNTER — Ambulatory Visit (INDEPENDENT_AMBULATORY_CARE_PROVIDER_SITE_OTHER): Payer: BC Managed Care – PPO | Admitting: Obstetrics & Gynecology

## 2011-12-18 VITALS — BP 125/91 | HR 90 | Ht 65.5 in | Wt 185.0 lb

## 2011-12-18 DIAGNOSIS — N949 Unspecified condition associated with female genital organs and menstrual cycle: Secondary | ICD-10-CM

## 2011-12-18 DIAGNOSIS — R102 Pelvic and perineal pain: Secondary | ICD-10-CM

## 2011-12-18 DIAGNOSIS — Z0000 Encounter for general adult medical examination without abnormal findings: Secondary | ICD-10-CM

## 2011-12-18 DIAGNOSIS — N6310 Unspecified lump in the right breast, unspecified quadrant: Secondary | ICD-10-CM

## 2011-12-18 DIAGNOSIS — N63 Unspecified lump in unspecified breast: Secondary | ICD-10-CM

## 2011-12-18 NOTE — Progress Notes (Signed)
  Subjective:    Patient ID: Lori Levy, female    DOB: 10/31/1965, 46 y.o.   MRN: [id number]  HPI 46 yo M AA lady who is status post TVH in the distant past comes in with 3 issues. 1)desires STI screening, 2) pelvic pain that is intermittent, not daily since June (about 2 months). She denies any provocations or palliations. She tried Aleve without relief. Denies dysparunia. 6 on a scale of 10. 3) breast tenderness for the last 6-8 months. Maybe worse with caffeine.   Review of Systems    mammogram due, She denies IBS symptoms Objective:   Physical Exam  Right breast with a 3 cm mobile tender area that she says has been there, coming and going, for about 10 years Normal bimanual exam- NT and no adnexal masses      Assessment & Plan:  1) desire for STI testing 2) breast lump- schedule screening mammogram of left and diagnostic one of right breast 3) pelvic pain- schedule u/s

## 2011-12-18 NOTE — Progress Notes (Signed)
Patient is here for pelvic pain and increased breast tenderness that comes and goes, she feels like this might be related to what she eats.  She feels like her symptoms she had prior to her hysterctomy last year are gradually coming back, including migraines.

## 2011-12-18 NOTE — Addendum Note (Signed)
Addended by: Allie Bossier on: 12/18/2011 03:38 PM   Modules accepted: Orders

## 2011-12-19 LAB — HEPATITIS C ANTIBODY: HCV Ab: NEGATIVE

## 2011-12-19 LAB — HEPATITIS B SURFACE ANTIGEN: Hepatitis B Surface Ag: NEGATIVE

## 2011-12-19 LAB — HIV ANTIBODY (ROUTINE TESTING W REFLEX): HIV: NONREACTIVE

## 2011-12-19 LAB — RPR

## 2011-12-21 ENCOUNTER — Ambulatory Visit (HOSPITAL_COMMUNITY)
Admission: RE | Admit: 2011-12-21 | Discharge: 2011-12-21 | Disposition: A | Discharge status: Home / Self Care | Payer: BC Managed Care – PPO | Source: Ambulatory Visit | Attending: Obstetrics & Gynecology | Admitting: Obstetrics & Gynecology

## 2011-12-21 DIAGNOSIS — R102 Pelvic and perineal pain: Secondary | ICD-10-CM

## 2011-12-21 DIAGNOSIS — Z90710 Acquired absence of both cervix and uterus: Secondary | ICD-10-CM | POA: Insufficient documentation

## 2011-12-21 DIAGNOSIS — N949 Unspecified condition associated with female genital organs and menstrual cycle: Secondary | ICD-10-CM | POA: Insufficient documentation

## 2011-12-22 ENCOUNTER — Other Ambulatory Visit: Payer: Self-pay | Admitting: Obstetrics & Gynecology

## 2011-12-22 ENCOUNTER — Ambulatory Visit
Admission: RE | Admit: 2011-12-22 | Discharge: 2011-12-22 | Disposition: A | Discharge status: Home / Self Care | Payer: BC Managed Care – PPO | Source: Ambulatory Visit | Attending: Obstetrics & Gynecology | Admitting: Obstetrics & Gynecology

## 2011-12-22 DIAGNOSIS — N6310 Unspecified lump in the right breast, unspecified quadrant: Secondary | ICD-10-CM

## 2012-01-15 ENCOUNTER — Ambulatory Visit: Payer: BC Managed Care – PPO | Admitting: Obstetrics & Gynecology

## 2012-06-28 ENCOUNTER — Telehealth: Payer: Self-pay | Admitting: Gynecology

## 2012-06-28 DIAGNOSIS — B379 Candidiasis, unspecified: Secondary | ICD-10-CM

## 2012-06-28 MED ORDER — FLUCONAZOLE 150 MG PO TABS: 150.0000 mg | ORAL_TABLET | Freq: Once | ORAL | Status: DC

## 2012-06-28 NOTE — Telephone Encounter (Signed)
Patient called regarding a refill on her diflucan 150mg . Per patient symptoms not better. She would like this called in to the [facility] pharmacy in [place].

## 2012-08-19 ENCOUNTER — Other Ambulatory Visit: Payer: Self-pay | Admitting: Obstetrics & Gynecology

## 2012-09-09 ENCOUNTER — Telehealth: Payer: Self-pay | Admitting: *Deleted

## 2012-09-09 DIAGNOSIS — B373 Candidiasis of vulva and vagina: Secondary | ICD-10-CM

## 2012-09-09 MED ORDER — FLUCONAZOLE 150 MG PO TABS: 150.0000 mg | ORAL_TABLET | Freq: Once | ORAL | Status: DC

## 2012-09-09 NOTE — Telephone Encounter (Signed)
Patient is requesting refill of her Diflucan to be called to [facility] [address].

## 2013-04-29 ENCOUNTER — Ambulatory Visit (INDEPENDENT_AMBULATORY_CARE_PROVIDER_SITE_OTHER): Payer: BC Managed Care – PPO | Admitting: Family Medicine

## 2013-04-29 ENCOUNTER — Encounter: Payer: Self-pay | Admitting: Family Medicine

## 2013-04-29 VITALS — BP 142/95 | HR 86 | Ht 65.5 in | Wt 190.0 lb

## 2013-04-29 DIAGNOSIS — N898 Other specified noninflammatory disorders of vagina: Secondary | ICD-10-CM

## 2013-04-29 MED ORDER — FLUCONAZOLE 150 MG PO TABS: 150.0000 mg | ORAL_TABLET | Freq: Every day | ORAL | Status: DC

## 2013-04-29 NOTE — Assessment & Plan Note (Signed)
Treat for presumptive yeast.  Check wet prep.

## 2013-04-29 NOTE — Patient Instructions (Signed)
Vaginitis Vaginitis is an inflammation of the vagina. It is most often caused by a change in the normal balance of the bacteria and yeast that live in the vagina. This change in balance causes an overgrowth of certain bacteria or yeast, which causes the inflammation. There are different types of vaginitis, but the most common types are:  Bacterial vaginosis.  Yeast infection (candidiasis).  Trichomoniasis vaginitis. This is a sexually transmitted infection (STI).  Viral vaginitis.  Atropic vaginitis.  Allergic vaginitis. CAUSES  The cause depends on the type of vaginitis. Vaginitis can be caused by:  Bacteria (bacterial vaginosis).  Yeast (yeast infection).  A parasite (trichomoniasis vaginitis)  A virus (viral vaginitis).  Low hormone levels (atrophic vaginitis). Low hormone levels can occur during pregnancy, breastfeeding, or after menopause.  Irritants, such as bubble baths, scented tampons, and feminine sprays (allergic vaginitis). Other factors can change the normal balance of the yeast and bacteria that live in the vagina. These include:  Antibiotic medicines.  Poor hygiene.  Diaphragms, vaginal sponges, spermicides, birth control pills, and intrauterine devices (IUD).  Sexual intercourse.  Infection.  Uncontrolled diabetes.  A weakened immune system. SYMPTOMS  Symptoms can vary depending on the cause of the vaginitis. Common symptoms include:  Abnormal vaginal discharge.  The discharge is white, gray, or yellow with bacterial vaginosis.  The discharge is thick, white, and cheesy with a yeast infection.  The discharge is frothy and yellow or greenish with trichomoniasis.  A bad vaginal odor.  The odor is fishy with bacterial vaginosis.  Vaginal itching, pain, or swelling.  Painful intercourse.  Pain or burning when urinating. Sometimes, there are no symptoms. TREATMENT  Treatment will vary depending on the type of infection.   Bacterial  vaginosis and trichomoniasis are often treated with antibiotic creams or pills.  Yeast infections are often treated with antifungal medicines, such as vaginal creams or suppositories.  Viral vaginitis has no cure, but symptoms can be treated with medicines that relieve discomfort. Your sexual partner should be treated as well.  Atrophic vaginitis may be treated with an estrogen cream, pill, suppository, or vaginal ring. If vaginal dryness occurs, lubricants and moisturizing creams may help. You may be told to avoid scented soaps, sprays, or douches.  Allergic vaginitis treatment involves quitting the use of the product that is causing the problem. Vaginal creams can be used to treat the symptoms. HOME CARE INSTRUCTIONS   Take all medicines as directed by your caregiver.  Keep your genital area clean and dry. Avoid soap and only rinse the area with water.  Avoid douching. It can remove the healthy bacteria in the vagina.  Do not use tampons or have sexual intercourse until your vaginitis has been treated. Use sanitary pads while you have vaginitis.  Wipe from front to back. This avoids the spread of bacteria from the rectum to the vagina.  Let air reach your genital area.  Wear cotton underwear to decrease moisture buildup.  Avoid wearing underwear while you sleep until your vaginitis is gone.  Avoid tight pants and underwear or nylons without a cotton panel.  Take off wet clothing (especially bathing suits) as soon as possible.  Use mild, non-scented products. Avoid using irritants, such as:  Scented feminine sprays.  Fabric softeners.  Scented detergents.  Scented tampons.  Scented soaps or bubble baths.  Practice safe sex and use condoms. Condoms may prevent the spread of trichomoniasis and viral vaginitis. SEEK MEDICAL CARE IF:   You have abdominal pain.  You   have a fever or persistent symptoms for more than 2 3 days.  You have a fever and your symptoms suddenly  get worse. Document Released: 03/12/2007 Document Revised: 02/07/2012 Document Reviewed: 10/26/2011 Exit[employer] Patient Information 2014 [employer].  

## 2013-04-29 NOTE — Progress Notes (Signed)
   Subjective:    Patient ID: Lori Levy, female    DOB: 07/21/1965, 47 y.o.   MRN: [id number]  HPI 2 day h/o vaginal discharge and odor.  Mild irritation, no itching. Mild dysuria, no F/c, no N/V. No new sexual partner.  Review of Systems  Constitutional: Negative for fever and chills.  Respiratory: Negative for shortness of breath.   Cardiovascular: Negative for chest pain.  Gastrointestinal: Negative for nausea, vomiting, abdominal pain and diarrhea.  Genitourinary: Positive for dysuria. Negative for genital sores.       Objective:   Physical Exam  Vitals reviewed. Constitutional: She appears well-developed and well-nourished.  HENT:  Head: Normocephalic and atraumatic.  Eyes: No scleral icterus.  Neck: Neck supple.  Cardiovascular: Normal rate and regular rhythm.   Pulmonary/Chest: Effort normal.  Abdominal: Soft. There is no tenderness.  Genitourinary: Uterus normal. Right adnexum displays no mass and no tenderness. Left adnexum displays no mass and no tenderness. No erythema around the vagina. Vaginal discharge (white, adherent) found.          Assessment & Plan:

## 2013-04-30 LAB — WET PREP, GENITAL
Trich, Wet Prep: NONE SEEN
Yeast Wet Prep HPF POC: NONE SEEN

## 2013-05-01 ENCOUNTER — Telehealth: Payer: Self-pay | Admitting: *Deleted

## 2013-05-01 NOTE — Telephone Encounter (Signed)
Patient called back in to let us know that this was taken care of yesterday.

## 2013-05-01 NOTE — Telephone Encounter (Signed)
Message copied by Barbara Cower on Thu May 01, 2013 10:30 AM ------      Message from: Reva Bores      Created: Wed Apr 30, 2013  3:00 PM       Wet prep shows BV--call her in Flagyl 500 mg po bid x 7 day # 14 no RF. ------

## 2013-06-03 ENCOUNTER — Other Ambulatory Visit: Payer: Self-pay | Admitting: Family Medicine

## 2013-08-06 ENCOUNTER — Other Ambulatory Visit: Payer: Self-pay | Admitting: Family Medicine

## 2013-09-11 ENCOUNTER — Other Ambulatory Visit: Payer: Self-pay | Admitting: Family Medicine

## 2013-11-11 ENCOUNTER — Other Ambulatory Visit: Payer: Self-pay | Admitting: Family Medicine

## 2014-02-03 IMAGING — US US TRANSVAGINAL NON-OB
1 series · 14 of 25 positions shown · non-contrast
Comparison: 12/14/2009

CLINICAL DATA: Pelvic pain.  Post hysterectomy.

TRANSABDOMINAL AND TRANSVAGINAL ULTRASOUND OF PELVIS
TECHNIQUE: Both transabdominal and transvaginal ultrasound
examinations of the pelvis were performed. Transabdominal technique
was performed for global imaging of the pelvis including vaginal
cuff, ovaries, adnexal regions, and pelvic cul-de-sac.
It was necessary to proceed with endovaginal exam following the
transabdominal exam to visualize the vaginal cuff and adnexa.

[Series 1: us pelvis complete · 14 of 25 slices shown]
[im 1/25]
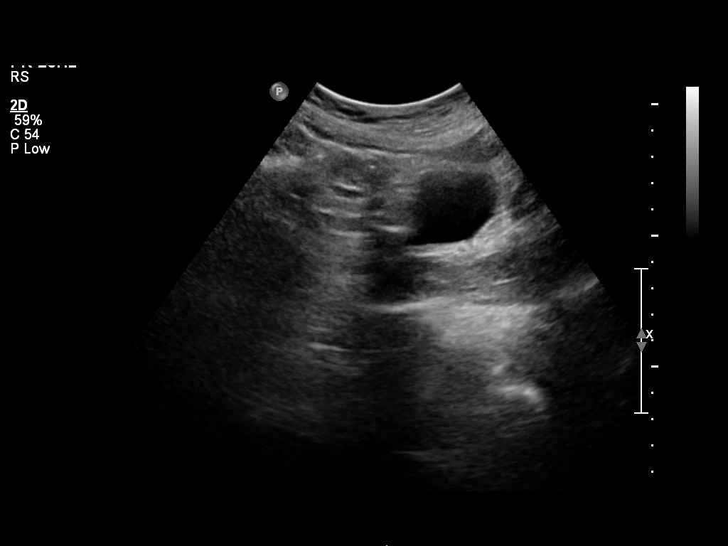
[im 3/25]
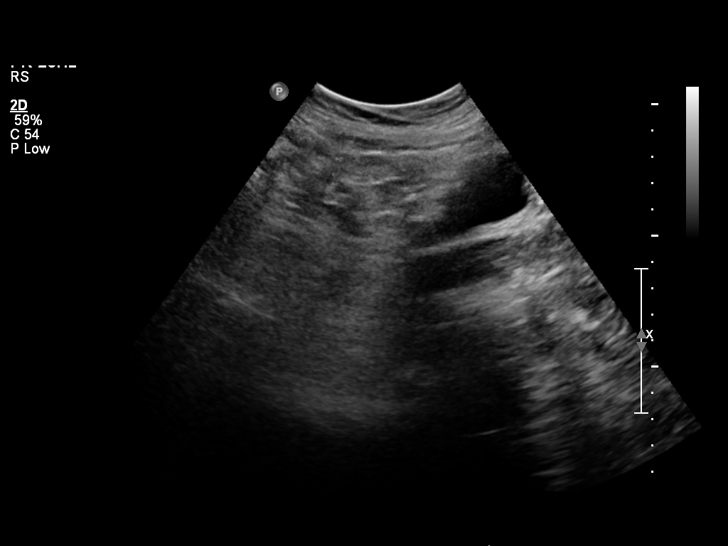
[im 5/25]
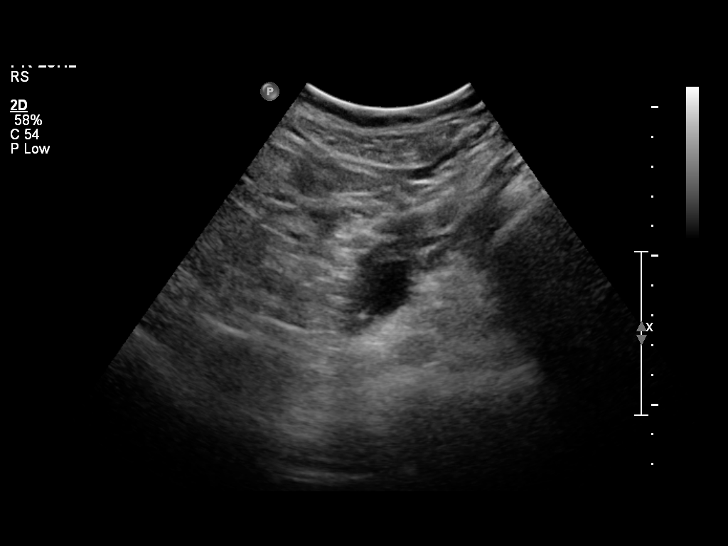
[im 7/25]
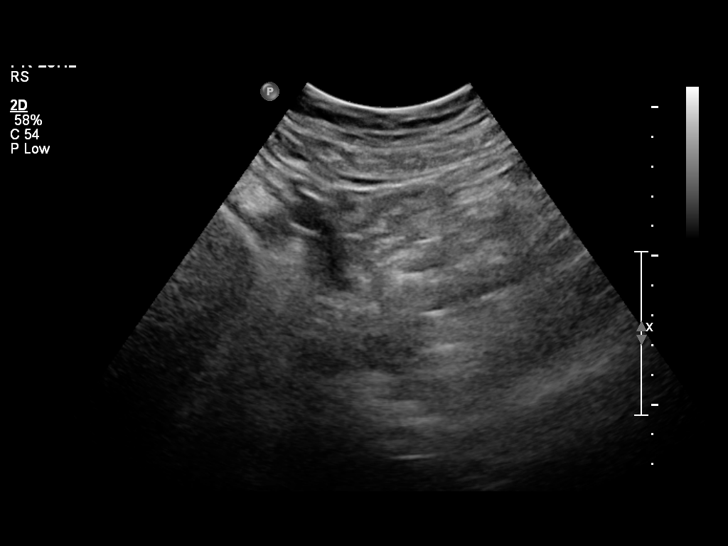
[im 9/25]
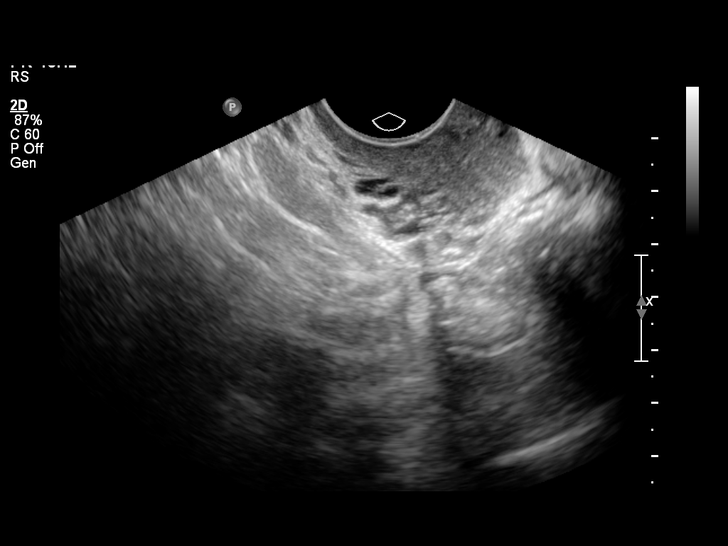
[im 10/25]
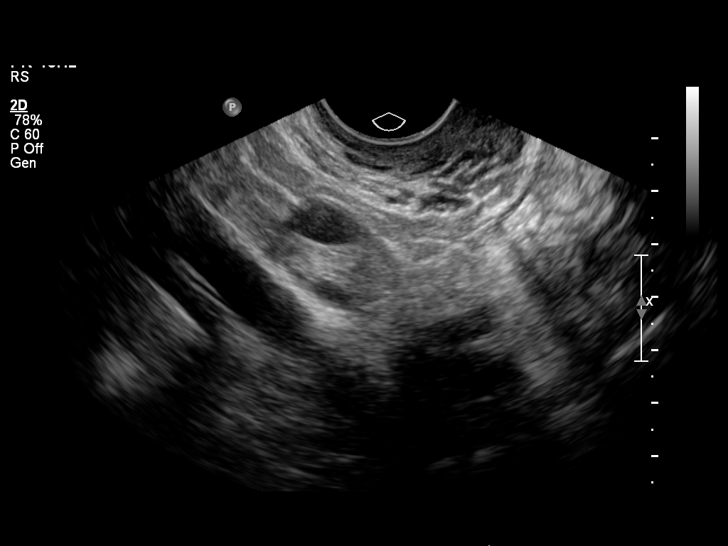
[im 12/25]
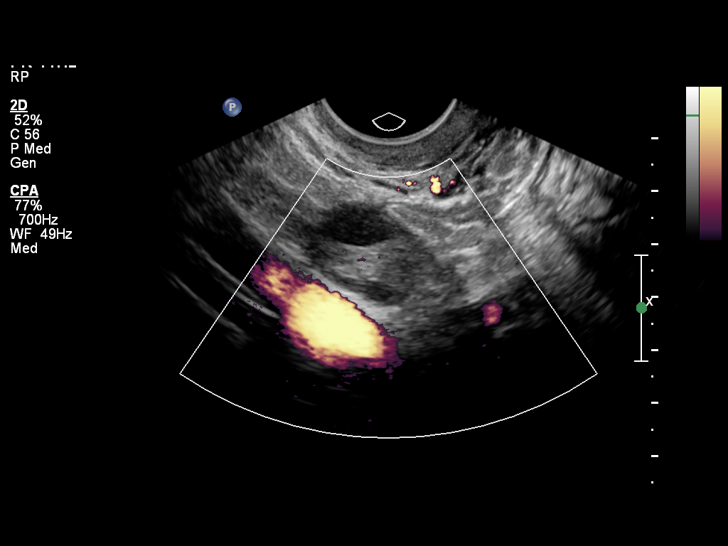
[im 14/25]
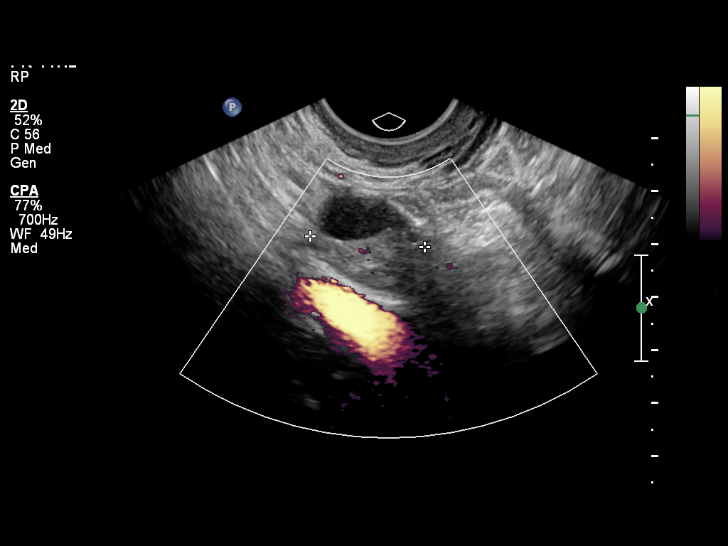
[im 16/25]
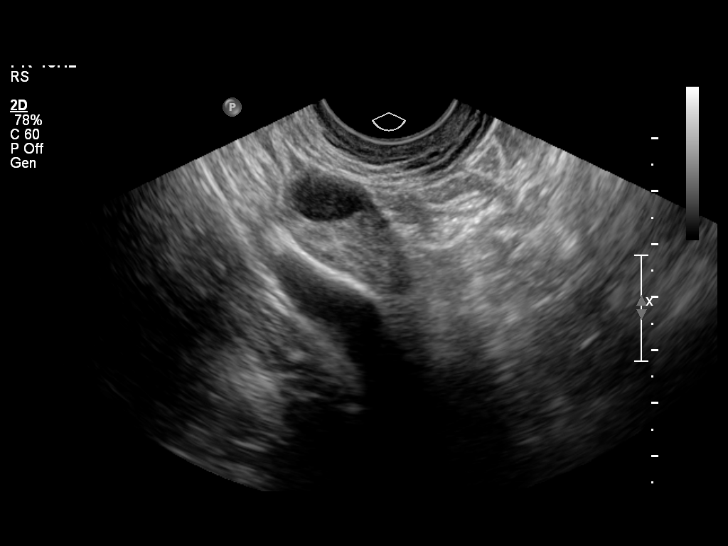
[im 17/25]
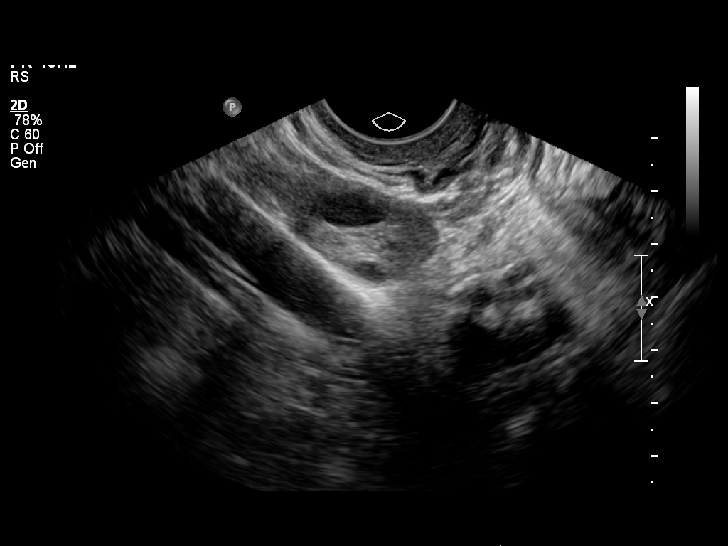
[im 19/25]
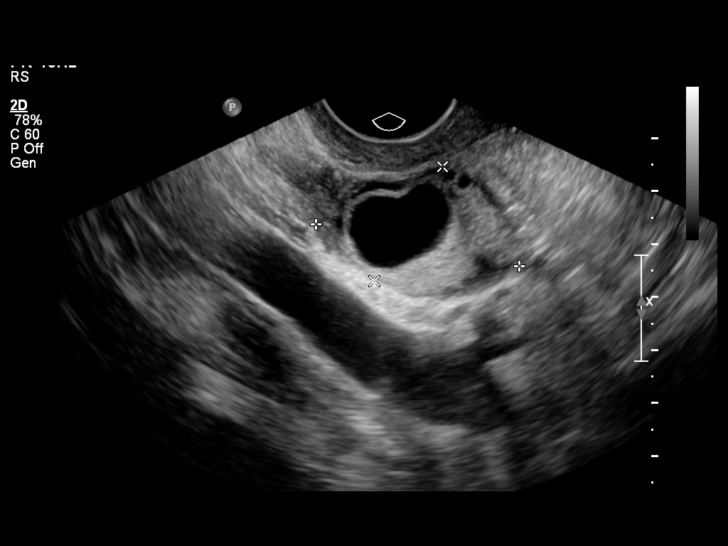
[im 21/25]
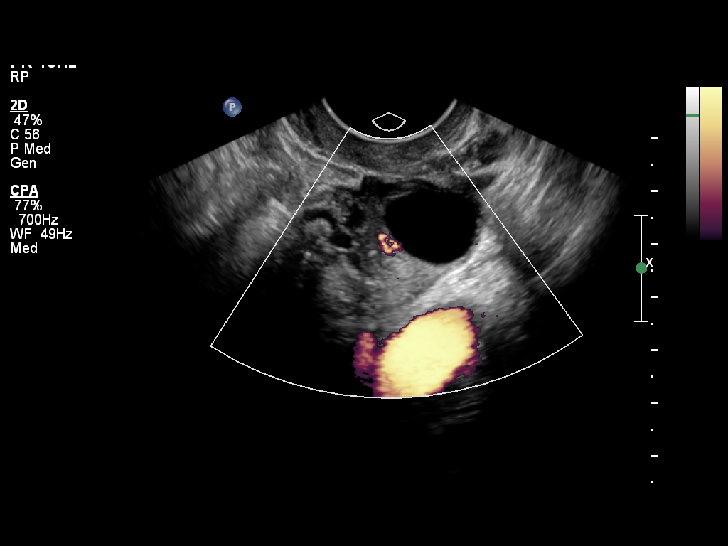
[im 23/25]
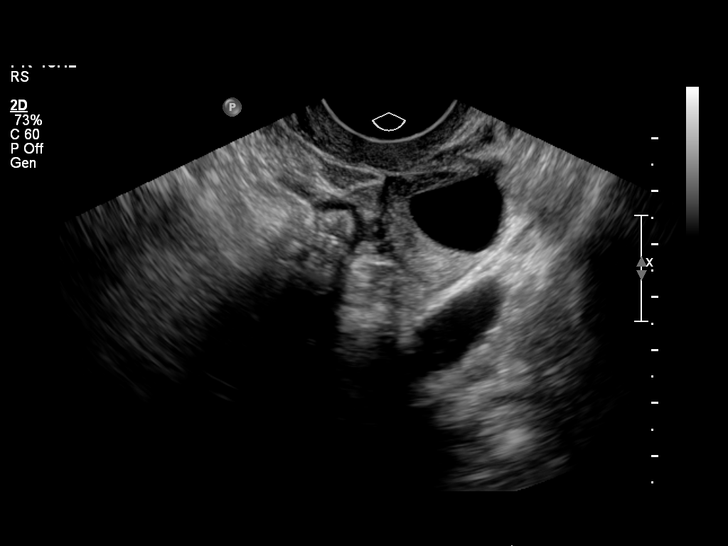
[im 25/25]
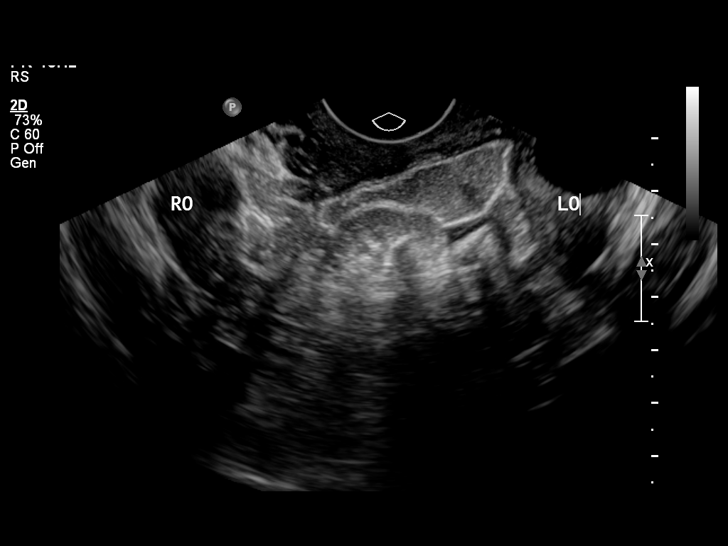

[14 of 25 positions shown; findings below may reference images not displayed]

FINDINGS: Uterus: Has been surgically removed since the previous exam.
Normal vaginal cuff is identified

Endometrium: Not applicable

Right ovary:  Has a normal appearance measuring 2.5 x 1.7 x 2.2 cm

Left ovary: Has a normal appearance measuring 3.9 x 2.5 x 2.8 cm
and contains a collapsing dominant follicle

Other findings: No pelvic fluid or separate adnexal masses are seen
IMPRESSION: Normal posthysterectomy vaginal cuff and ovaries.

## 2014-02-05 ENCOUNTER — Other Ambulatory Visit: Payer: Self-pay | Admitting: Family Medicine

## 2014-03-30 ENCOUNTER — Encounter: Payer: Self-pay | Admitting: Family Medicine

## 2016-10-09 ENCOUNTER — Telehealth: Payer: Self-pay | Admitting: *Deleted

## 2016-10-09 DIAGNOSIS — B379 Candidiasis, unspecified: Secondary | ICD-10-CM

## 2016-10-09 DIAGNOSIS — T3695XA Adverse effect of unspecified systemic antibiotic, initial encounter: Principal | ICD-10-CM

## 2016-10-09 MED ORDER — FLUCONAZOLE 150 MG PO TABS: ORAL_TABLET | ORAL | 0 refills | Status: DC

## 2016-10-09 NOTE — Telephone Encounter (Signed)
-----   Message from Lindell SparHeather L Bacon, [place] sent at 10/09/2016  8:48 AM EDT ----- Regarding: pt is requesting Diflucan sent to pharmacy Contact: [phone] [facility] please send same dosage that Dr Marice Potterove prescribed last time

## 2016-10-09 NOTE — Telephone Encounter (Signed)
Pt was taking antibiotics and requesting Diflucan to treat yeast infection.  Diflucan sent to pharmacy and scheduled pt for annual exam on 11-24-16.

## 2016-11-24 ENCOUNTER — Ambulatory Visit (INDEPENDENT_AMBULATORY_CARE_PROVIDER_SITE_OTHER): Payer: Managed Care, Other (non HMO) | Admitting: Obstetrics & Gynecology

## 2016-11-24 ENCOUNTER — Other Ambulatory Visit: Payer: Self-pay | Admitting: Obstetrics & Gynecology

## 2016-11-24 ENCOUNTER — Encounter: Payer: Self-pay | Admitting: Obstetrics & Gynecology

## 2016-11-24 VITALS — BP 134/88 | HR 90 | Resp 18 | Ht 67.5 in | Wt 201.0 lb

## 2016-11-24 DIAGNOSIS — Z01419 Encounter for gynecological examination (general) (routine) without abnormal findings: Secondary | ICD-10-CM

## 2016-11-24 DIAGNOSIS — Z1231 Encounter for screening mammogram for malignant neoplasm of breast: Secondary | ICD-10-CM

## 2016-11-24 NOTE — Progress Notes (Signed)
Subjective:    Lori Levy is a 51 y.o. M AA P3 (3 grands) female who presents for an annual exam. The patient has no complaints today. The patient is sexually active. GYN screening history: last pap: was normal. The patient wears seatbelts: yes. The patient participates in regular exercise: yes. Has the patient ever been transfused or tattooed?: no. The patient reports that there is not domestic violence in her life.   Menstrual History: OB History    Gravida Para Term Preterm AB Living   3 3 3     3    SAB TAB Ectopic Multiple Live Births           413      Menarche age: [age] No LMP recorded. Patient has had a hysterectomy.    The following portions of the patient's history were reviewed and updated as appropriate: allergies, current medications, past family history, past medical history, past social history, past surgical history and problem list.  Review of Systems Pertinent items are noted in HPI.   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 breast, gyn, colon cancer Denies dyspareunia   Objective:    BP 134/88   Pulse 90   Resp 18   Ht 5' 7.5" (1.715 m)   Wt 201 lb (91.2 kg)   BMI 31.02 kg/m   General Appearance:    Alert, cooperative, no distress, appears stated age  Head:    Normocephalic, without obvious abnormality, atraumatic  Eyes:    PERRL, conjunctiva/corneas clear, EOM's intact, fundi    benign, both eyes  Ears:    Normal TM's and external ear canals, both ears  Nose:   Nares normal, septum midline, mucosa normal, no drainage    or sinus tenderness  Throat:   Lips, mucosa, and tongue normal; teeth and gums normal  Neck:   Supple, symmetrical, trachea midline, no adenopathy;    thyroid:  no enlargement/tenderness/nodules; no carotid   bruit or JVD  Back:     Symmetric, no curvature, ROM normal, no CVA tenderness  Lungs:     Clear to auscultation bilaterally, respirations unlabored  Chest Wall:    No tenderness or deformity   Heart:    Regular rate  and rhythm, S1 and S2 normal, no murmur, rub   or gallop  Breast Exam:    No tenderness, masses, or nipple abnormality, fibrocystic changes bilateral  Abdomen:     Soft, non-tender, bowel sounds active all four quadrants,    no masses, no organomegaly  Genitalia:    Normal female without lesion, discharge or tenderness, normal vaginal cuff, no masses or tenderness with bimanual exam     Extremities:   Extremities normal, atraumatic, no cyanosis or edema  Pulses:   2+ and symmetric all extremities  Skin:   Skin color, texture, turgor normal, no rashes or lesions  Lymph nodes:   Cervical, supraclavicular, and axillary nodes normal  Neurologic:   CNII-XII intact, normal strength, sensation and reflexes    throughout  .    Assessment:    Healthy female exam.    Plan:     Mammogram.   Fasting labs today

## 2016-11-25 LAB — COMPREHENSIVE METABOLIC PANEL
ALK PHOS: 88 IU/L (ref 39–117)
ALT: 10 IU/L (ref 0–32)
AST: 16 IU/L (ref 0–40)
Albumin/Globulin Ratio: 1.2 (ref 1.2–2.2)
Albumin: 4.2 g/dL (ref 3.5–5.5)
BILIRUBIN TOTAL: 0.7 mg/dL (ref 0.0–1.2)
BUN/Creatinine Ratio: 16 (ref 9–23)
BUN: 14 mg/dL (ref 6–24)
CHLORIDE: 102 mmol/L (ref 96–106)
CO2: 23 mmol/L (ref 20–29)
Calcium: 9.7 mg/dL (ref 8.7–10.2)
Creatinine, Ser: 0.9 mg/dL (ref 0.57–1.00)
GFR calc Af Amer: 87 mL/min/{1.73_m2} (ref >59)
GFR calc non Af Amer: 75 mL/min/{1.73_m2} (ref >59)
GLUCOSE: 110 mg/dL — AB (ref 65–99)
Globulin, Total: 3.5 g/dL (ref 1.5–4.5)
Potassium: 4.7 mmol/L (ref 3.5–5.2)
Sodium: 141 mmol/L (ref 134–144)
Total Protein: 7.7 g/dL (ref 6.0–8.5)

## 2016-11-25 LAB — CBC
Hematocrit: 44.6 % (ref 34.0–46.6)
Hemoglobin: 13.7 g/dL (ref 11.1–15.9)
MCH: 28.2 pg (ref 26.6–33.0)
MCHC: 30.7 g/dL — AB (ref 31.5–35.7)
MCV: 92 fL (ref 79–97)
PLATELETS: 214 10*3/uL (ref 150–379)
RBC: 4.86 x10E6/uL (ref 3.77–5.28)
RDW: 14.3 % (ref 12.3–15.4)
WBC: 5 10*3/uL (ref 3.4–10.8)

## 2016-11-25 LAB — LIPID PANEL
Chol/HDL Ratio: 2.8 ratio (ref 0.0–4.4)
Cholesterol, Total: 179 mg/dL (ref 100–199)
HDL: 64 mg/dL (ref >39)
LDL CALC: 100 mg/dL — AB (ref 0–99)
TRIGLYCERIDES: 77 mg/dL (ref 0–149)
VLDL Cholesterol Cal: 15 mg/dL (ref 5–40)

## 2016-11-25 LAB — TSH: TSH: 1.24 u[IU]/mL (ref 0.450–4.500)

## 2016-11-28 ENCOUNTER — Telehealth: Payer: Self-pay | Admitting: *Deleted

## 2016-11-28 NOTE — Telephone Encounter (Signed)
Informed pt of result, pt will call to establish care with a PCP to follow-up with her elevated fasting BS.

## 2016-11-28 NOTE — Telephone Encounter (Signed)
-----   Message from Allie BossierMyra C Dove, MD sent at 11/28/2016 10:45 AM EDT ----- Her fasting sugar is too high. She needs to see her primary care MD about this. Thanks

## 2016-12-07 ENCOUNTER — Ambulatory Visit
Admission: RE | Admit: 2016-12-07 | Discharge: 2016-12-07 | Disposition: A | Discharge status: Home / Self Care | Payer: Managed Care, Other (non HMO) | Source: Ambulatory Visit | Attending: Obstetrics & Gynecology | Admitting: Obstetrics & Gynecology

## 2016-12-07 DIAGNOSIS — Z1231 Encounter for screening mammogram for malignant neoplasm of breast: Secondary | ICD-10-CM

## 2017-08-27 ENCOUNTER — Telehealth: Payer: Self-pay | Admitting: Family Medicine

## 2017-08-27 NOTE — Telephone Encounter (Signed)
Copied from CRM [id number]. Topic: Quick Communication - See Telephone Encounter >> Aug 27, 2017  4:34 PM Terisa Starraylor, Brittany L wrote: CRM for notification. See Telephone encounter for: 08/27/17.  Patient was seen in the ER over the weekend for a BP reading of 200/140, she was having chest pain. They ruled out she was fine but wanted her to check with her PCP to see if her new patient appt could be moved up. Dr Carlynn PurlSowles does not have anything any sooner. Patient said right now her bp is 158/104 with no symptom complaint. She wants to know if a nurse can call her back and get her worked in quicker. Call back is [phone]. Her new patient appt is 5/30.

## 2017-08-28 NOTE — Telephone Encounter (Signed)
Called pt to explain to her that at this time we do not have any availabilities for a NP.

## 2017-08-28 NOTE — Telephone Encounter (Signed)
Pt calling back to ask if Dr Carlynn PurlSowles will work her in sooner.   Advised pt Dr Carlynn PurlSowles schedule is very full, and does not have anything sooner.  Pt would still like someone to ask.

## 2017-09-03 ENCOUNTER — Encounter: Payer: Self-pay | Admitting: Radiology

## 2017-10-25 ENCOUNTER — Ambulatory Visit: Payer: Self-pay | Admitting: Family Medicine

## 2018-02-06 ENCOUNTER — Ambulatory Visit: Payer: Self-pay | Admitting: Family Medicine

## 2018-02-06 ENCOUNTER — Encounter

## 2018-03-27 ENCOUNTER — Ambulatory Visit (INDEPENDENT_AMBULATORY_CARE_PROVIDER_SITE_OTHER): Payer: Managed Care, Other (non HMO)

## 2018-03-27 DIAGNOSIS — B373 Candidiasis of vulva and vagina: Secondary | ICD-10-CM

## 2018-03-27 DIAGNOSIS — N898 Other specified noninflammatory disorders of vagina: Secondary | ICD-10-CM | POA: Diagnosis not present

## 2018-03-27 MED ORDER — FLUCONAZOLE 150 MG PO TABS: 150.0000 mg | ORAL_TABLET | Freq: Once | ORAL | 1 refills | Status: AC

## 2018-03-27 NOTE — Progress Notes (Signed)
I have reviewed the chart and agree with nursing staff's documentation of this patient's encounter.  Jaynie Collins, MD 03/27/2018 1:47 PM

## 2018-03-27 NOTE — Progress Notes (Signed)
SUBJECTIVE:  52 y.o. female complains of vaginal discharge for a couple of days.  Denies abnormal vaginal bleeding or significant pelvic pain or fever. No UTI symptoms. Denies history of known exposure to STD.  No LMP recorded. Patient has had a hysterectomy.  OBJECTIVE:  She appears well, afebrile. Urine dipstick: not completed today  ASSESSMENT:  Vaginal Discharge: none  Vaginal Odor:small amount   PLAN:   BVAG, CVAG probe sent to lab. Patient is requesting a diflucan be called into the pharmacy.  Treatment: To be determined once lab results are received ROV prn if symptoms persist or worsen.

## 2018-03-27 NOTE — Addendum Note (Signed)
Addended by: Cheree Ditto, Lanyiah Brix A on: 03/27/2018 09:13 AM   Modules accepted: Orders

## 2018-03-28 LAB — CERVICOVAGINAL ANCILLARY ONLY
Bacterial vaginitis: NEGATIVE
Candida vaginitis: POSITIVE — AB

## 2018-03-29 ENCOUNTER — Other Ambulatory Visit: Payer: Self-pay | Admitting: Obstetrics & Gynecology

## 2018-03-29 DIAGNOSIS — B373 Candidiasis of vulva and vagina: Secondary | ICD-10-CM

## 2018-03-29 DIAGNOSIS — B3731 Acute candidiasis of vulva and vagina: Secondary | ICD-10-CM

## 2018-03-29 MED ORDER — FLUCONAZOLE 150 MG PO TABS: 150.0000 mg | ORAL_TABLET | Freq: Once | ORAL | 3 refills | Status: AC

## 2018-03-29 NOTE — Progress Notes (Signed)
Vaginal discharge test is abnormal and showed yeast infection. Diflucan was prescribed.  Patient was informed via MyChart and advised to pick up prescription. 

## 2018-05-14 ENCOUNTER — Ambulatory Visit: Payer: Managed Care, Other (non HMO) | Admitting: Obstetrics & Gynecology

## 2018-05-14 DIAGNOSIS — Z01419 Encounter for gynecological examination (general) (routine) without abnormal findings: Secondary | ICD-10-CM

## 2020-06-22 ENCOUNTER — Encounter: Payer: Self-pay | Admitting: Obstetrics and Gynecology

## 2020-06-22 ENCOUNTER — Ambulatory Visit (INDEPENDENT_AMBULATORY_CARE_PROVIDER_SITE_OTHER): Payer: Managed Care, Other (non HMO) | Admitting: Obstetrics and Gynecology

## 2020-06-22 ENCOUNTER — Other Ambulatory Visit: Payer: Self-pay

## 2020-06-22 ENCOUNTER — Emergency Department
Admission: EM | Admit: 2020-06-22 | Discharge: 2020-06-22 | Disposition: A | Discharge status: Home / Self Care | Payer: Managed Care, Other (non HMO) | Attending: Student in an Organized Health Care Education/Training Program | Admitting: Student in an Organized Health Care Education/Training Program

## 2020-06-22 VITALS — BP 176/121 | HR 99 | Ht 65.5 in | Wt 219.0 lb

## 2020-06-22 DIAGNOSIS — Z1211 Encounter for screening for malignant neoplasm of colon: Secondary | ICD-10-CM

## 2020-06-22 DIAGNOSIS — Z01419 Encounter for gynecological examination (general) (routine) without abnormal findings: Secondary | ICD-10-CM

## 2020-06-22 DIAGNOSIS — I160 Hypertensive urgency: Secondary | ICD-10-CM

## 2020-06-22 DIAGNOSIS — Z1239 Encounter for other screening for malignant neoplasm of breast: Secondary | ICD-10-CM

## 2020-06-22 DIAGNOSIS — I10 Essential (primary) hypertension: Secondary | ICD-10-CM | POA: Diagnosis present

## 2020-06-22 DIAGNOSIS — N951 Menopausal and female climacteric states: Secondary | ICD-10-CM | POA: Insufficient documentation

## 2020-06-22 LAB — CBC WITH DIFFERENTIAL/PLATELET
Abs Immature Granulocytes: 0.01 10*3/uL (ref 0.00–0.07)
Basophils Absolute: 0 10*3/uL (ref 0.0–0.1)
Basophils Relative: 1 %
Eosinophils Absolute: 0.1 10*3/uL (ref 0.0–0.5)
Eosinophils Relative: 1 %
HCT: 44.5 % (ref 36.0–46.0)
Hemoglobin: 14.2 g/dL (ref 12.0–15.0)
Immature Granulocytes: 0 %
Lymphocytes Relative: 37 %
Lymphs Abs: 2.1 10*3/uL (ref 0.7–4.0)
MCH: 28.2 pg (ref 26.0–34.0)
MCHC: 31.9 g/dL (ref 30.0–36.0)
MCV: 88.5 fL (ref 80.0–100.0)
Monocytes Absolute: 0.5 10*3/uL (ref 0.1–1.0)
Monocytes Relative: 9 %
Neutro Abs: 3 10*3/uL (ref 1.7–7.7)
Neutrophils Relative %: 52 %
Platelets: 199 10*3/uL (ref 150–400)
RBC: 5.03 MIL/uL (ref 3.87–5.11)
RDW: 14.3 % (ref 11.5–15.5)
WBC: 5.6 10*3/uL (ref 4.0–10.5)
nRBC: 0 % (ref 0.0–0.2)

## 2020-06-22 LAB — COMPREHENSIVE METABOLIC PANEL
ALT: 16 U/L (ref 0–44)
AST: 20 U/L (ref 15–41)
Albumin: 4.3 g/dL (ref 3.5–5.0)
Alkaline Phosphatase: 87 U/L (ref 38–126)
Anion gap: 10 (ref 5–15)
BUN: 13 mg/dL (ref 6–20)
CO2: 26 mmol/L (ref 22–32)
Calcium: 9.6 mg/dL (ref 8.9–10.3)
Chloride: 102 mmol/L (ref 98–111)
Creatinine, Ser: 0.67 mg/dL (ref 0.44–1.00)
GFR, Estimated: >60 mL/min (ref >60)
Glucose, Bld: 122 mg/dL — ABNORMAL HIGH (ref 70–99)
Potassium: 3.8 mmol/L (ref 3.5–5.1)
Sodium: 138 mmol/L (ref 135–145)
Total Bilirubin: 0.8 mg/dL (ref 0.3–1.2)
Total Protein: 8.4 g/dL — ABNORMAL HIGH (ref 6.5–8.1)

## 2020-06-22 MED ORDER — HYDROCHLOROTHIAZIDE 25 MG PO TABS: 25.0000 mg | ORAL_TABLET | Freq: Every day | ORAL | 0 refills | Status: DC

## 2020-06-22 MED ORDER — CLONIDINE HCL 0.1 MG PO TABS
0.2000 mg | ORAL_TABLET | Freq: Once | ORAL | Status: AC
  Administered 2020-06-22: 0.2 mg via ORAL
  Filled 2020-06-22: qty 2

## 2020-06-22 MED ORDER — ACETAMINOPHEN 500 MG PO TABS
1000.0000 mg | ORAL_TABLET | Freq: Once | ORAL | Status: AC
  Administered 2020-06-22: 1000 mg via ORAL
  Filled 2020-06-22: qty 2

## 2020-06-22 NOTE — Progress Notes (Signed)
Having hot flashes for last 6 months, taking OTC estravin

## 2020-06-22 NOTE — ED Notes (Signed)
Pt states coming in for high blood pressure. Pt states no history of high blood pressure. Pt denies feeling dizzy, but that she does have a headache.

## 2020-06-22 NOTE — ED Provider Notes (Signed)
[facility] Emergency Department Provider Note ____________________________________________   Event Date/Time   First MD Initiated Contact with Patient 06/22/20 1944     (approximate)  I have reviewed the triage vital signs and the nursing notes.   HISTORY  Chief Complaint Hypertension  HPI Lori Levy is a 55 y.o. female with history of hypertension presents to the emergency department for treatment and evaluation of the same. She is unsure how long it has been elevated. She went to the Gyn for an unrelated issue today and was sent here due to hypertension. She denies chest pain.  She now has a headache but states that she did not have a headache prior to knowing how high her blood pressure is.         Past Medical History:  Diagnosis Date  . Amenorrhea   . Anemia   . Hypertension   . Menorrhagia   . Migraine     Patient Active Problem List   Diagnosis Date Noted  . Hypertensive urgency 06/22/2020  . Hot flashes due to menopause 06/22/2020  . Hypertension     Past Surgical History:  Procedure Laterality Date  . ENDOMETRIAL ABLATION  2011  . VAGINAL HYSTERECTOMY  2012    Prior to Admission medications   Medication Sig Start Date End Date Taking? Authorizing Provider  Multiple Vitamins-Minerals (MULTIVITAMIN WITH MINERALS) tablet Take 1 tablet by mouth daily.    [provider]  vitamin B-12 (CYANOCOBALAMIN) 1000 MCG tablet Take 1,000 mcg by mouth daily.    [provider]    Allergies Clarithromycin  No family history on file.  Social History Social History   Tobacco Use  . Smoking status: Never Smoker  . Smokeless tobacco: Never Used  Vaping Use  . Vaping Use: Never used  Substance Use Topics  . Alcohol use: Yes    Comment: OCCASIONALLY/SR    Review of Systems  Constitutional: No fever/chills Eyes: No visual changes. ENT: No sore throat. Cardiovascular: Denies chest pain. Respiratory: Denies  shortness of breath. Gastrointestinal: No abdominal pain.  No nausea, no vomiting.  No diarrhea.  No constipation. Genitourinary: Negative for dysuria. Musculoskeletal: Negative for back pain. Skin: Negative for rash. Neurological: Positive for headaches, negative for focal weakness or numbness  ____________________________________________   PHYSICAL EXAM:  VITAL SIGNS: ED Triage Vitals [06/22/20 1820]  Enc Vitals Group     BP (!) 214/96     Pulse Rate (!) 108     Resp 18     Temp 98.5 F (36.9 C)     Temp src      SpO2 100 %     Weight 210 lb (95.3 kg)     Height 5' 5.5" (1.664 m)     Head Circumference      Peak Flow      Pain Score 4     Pain Loc      Pain Edu?      Excl. in GC?     Constitutional: Alert and oriented. Well appearing and in no acute distress. Eyes: Conjunctivae are normal. PERRL. EOMI. Head: Atraumatic. Nose: No congestion/rhinnorhea. Mouth/Throat: Mucous membranes are moist.  Oropharynx non-erythematous. Neck: No stridor.   Hematological/Lymphatic/Immunilogical: No cervical lymphadenopathy. Cardiovascular: Normal rate, regular rhythm. Grossly normal heart sounds.  Good peripheral circulation. Respiratory: Normal respiratory effort.  No retractions. Lungs CTAB. Gastrointestinal: Soft and nontender. No distention. No abdominal bruits. Genitourinary:  Musculoskeletal: No lower extremity tenderness nor edema.  No joint effusions. Neurologic:  Normal speech and language. No gross focal neurologic deficits are appreciated. No gait instability. Skin:  Skin is warm, dry and intact. No rash noted. Psychiatric: Mood and affect are normal. Speech and behavior are normal.  ____________________________________________   LABS (all labs ordered are listed, but only abnormal results are displayed)  Labs Reviewed  COMPREHENSIVE METABOLIC PANEL  CBC WITH DIFFERENTIAL/PLATELET    ____________________________________________  EKG   ____________________________________________  RADIOLOGY  ED MD interpretation:    Not indicated  I, Kem Boroughs, personally viewed and evaluated these images (plain radiographs) as part of my medical decision making, as well as reviewing the written report by the radiologist.  Official radiology report(s): No results found.  ____________________________________________   PROCEDURES  Procedure(s) performed (including Critical Care):  Procedures  ____________________________________________   INITIAL IMPRESSION / ASSESSMENT AND PLAN     55 year old female presenting to the emergency department for treatment and evaluation of hypertension.  See HPI for further details.  Plan will be to get some baseline labs and give her clonidine then monitor her blood pressure.  ED COURSE  Patient's blood pressure came down nicely after clonidine.  Tylenol was given and her headache went away as well.  She will be prescribed hydrochlorothiazide daily.  She was encouraged to keep a log of her blood pressures morning and night to take to her primary care provider.  Patient states that she currently does not have a primary care provider but will try to get an appointment with someone tomorrow.  She was advised that if she develops any chest pain, shortness of breath, headache, or other acute concerns that she should return to the emergency department.    ___________________________________________   FINAL CLINICAL IMPRESSION(S) / ED DIAGNOSES  Final diagnoses:  None     ED Discharge Orders    None       Lori Levy was evaluated in Emergency Department on 06/22/2020 for the symptoms described in the history of present illness. She was evaluated in the context of the global COVID-19 pandemic, which necessitated consideration that the patient might be at risk for infection with the SARS-CoV-2 virus that causes COVID-19.  Institutional protocols and algorithms that pertain to the evaluation of patients at risk for COVID-19 are in a state of rapid change based on information released by regulatory bodies including the CDC and federal and state organizations. These policies and algorithms were followed during the patient's care in the ED.   Note:  This document was prepared using Dragon voice recognition software and may include unintentional dictation errors.   Chinita Pester, FNP 06/22/20 Phineas Douglas    Sharyn Creamer, MD 06/23/20 [phone]

## 2020-06-22 NOTE — ED Triage Notes (Addendum)
Pt comes via POV from [facility] with c/o hypertension. Pt states she went to her doctor appt for recent hot flashes and they checked her Bp and it was elevated so they sent her here.  Pt states in the last hour she has gotten an headache. Pt states she has never had this happen before. Pt is tearful in triage.  Pt denies any weakness, blurred vision or dizziness.

## 2020-06-22 NOTE — Progress Notes (Signed)
Obstetrics and Gynecology Annual Patient Evaluation  Appointment Date: 06/22/2020  OBGYN Clinic: [facility]  Primary Care Provider: None  Chief Complaint:  Chief Complaint  Patient presents with  . Gynecologic Exam  Hot flashes  History of Present Illness: Lori Levy is a 55 y.o. African-American [id number] (No LMP recorded. Patient has had a hysterectomy.), seen for the above chief complaint. Her past medical history is significant for HTN, BMI 30s, h/o vag hyst  Six month h/o hot flashes and night sweats. No vaginal dryness, discharge, bleeding    Review of Systems: Pertinent items noted in HPI and remainder of comprehensive ROS otherwise negative.   Patient Active Problem List   Diagnosis Date Noted  . Hypertensive urgency 06/22/2020  . Hot flashes due to menopause 06/22/2020  . Hypertension     Past Medical History:  Past Medical History:  Diagnosis Date  . Amenorrhea   . Anemia   . Hypertension   . Menorrhagia   . Migraine     Past Surgical History:  Past Surgical History:  Procedure Laterality Date  . ENDOMETRIAL ABLATION  2011  . VAGINAL HYSTERECTOMY  2012    Past Obstetrical History:  OB History  Gravida Para Term Preterm AB Living  3 3 3     3   SAB IAB Ectopic Multiple Live Births          3    # Outcome Date GA Lbr Len/2nd Weight Sex Delivery Anes PTL Lv  3 Term      Vag-Spont   LIV  2 Term      Vag-Spont   LIV  1 Term      Vag-Spont   LIV    Past Gynecological History: As per HPI. History of HRT use: No.   Social History:  Social History   Socioeconomic History  . Marital status: Married    Spouse name: Not on file  . Number of children: Not on file  . Years of education: Not on file  . Highest education level: Not on file  Occupational History  . Not on file  Tobacco Use  . Smoking status: Never Smoker  . Smokeless tobacco: Never Used  Vaping Use  . Vaping Use: Never used  Substance and Sexual  Activity  . Alcohol use: Yes    Comment: OCCASIONALLY/SR  . Drug use: Not on file  . Sexual activity: Yes    Birth control/protection: Surgical  Other Topics Concern  . Not on file  Social History Narrative  . Not on file   Social Determinants of Health   Financial Resource Strain: Not on file  Food Insecurity: Not on file  Transportation Needs: Not on file  Physical Activity: Not on file  Stress: Not on file  Social Connections: Not on file  Intimate Partner Violence: Not on file    Family History: History reviewed. No pertinent family history. She denies any female cancers  Medications Tippi Mccrae. [place] had no medications administered during this visit. Current Outpatient Medications  Medication Sig Dispense Refill  . Multiple Vitamins-Minerals (MULTIVITAMIN WITH MINERALS) tablet Take 1 tablet by mouth daily.    . vitamin B-12 (CYANOCOBALAMIN) 1000 MCG tablet Take 1,000 mcg by mouth daily.     No current facility-administered medications for this visit.    Allergies Clarithromycin   Physical Exam:  183/127  BP (!) 176/121   Pulse 99   Ht 5' 5.5" (1.664 m)   Wt 219 lb (99.3 kg)  BMI 35.89 kg/m  Body mass index is 35.89 kg/m. General appearance: Well nourished, well developed female in no acute distress.  Cardiovascular: normal s1 and s2.  No murmurs, rubs or gallops. Respiratory:  Clear to auscultation bilateral. Normal respiratory effort Abdomen: positive bowel sounds and no masses, hernias; diffusely non tender to palpation, non distended Breasts: breasts appear normal, no suspicious masses, no skin or nipple changes or axillary nodes, and normal exam. Neuro/Psych:  Normal mood and affect.  Skin:  Warm and dry.  Lymphatic:  No inguinal lymphadenopathy.   Pelvic exam: is not limited by body habitus EGBUS: within normal limits Vagina: within normal limits and with no blood or discharge in the vault. Normal cuff Cervix and uterus: surgically absent.   Adnexa:  normal adnexa and no mass, fullness, tenderness Rectovaginal: deferred  Laboratory: none  Radiology: none  Assessment: pt stable  Plan:  1. Hypertensive urgency No chest pain, sob, ha or visual s/s. Given values, I told her I recommend asap urgent care evaluation. Options given to her and also d/w her that they can get her set up with a PCP faster too  2. Hot flashes due to menopause Can re assess at 41m when health is better but I told her that she is not a candidate for estrogen.   3. Well woman exam with routine gynecological exam  RTC 72m   Cornelia Copa MD Attending Center for [facility])

## 2020-06-28 ENCOUNTER — Telehealth: Payer: Managed Care, Other (non HMO)

## 2020-06-28 ENCOUNTER — Telehealth: Payer: Self-pay

## 2020-06-28 NOTE — Telephone Encounter (Signed)
Attempted to contact patient this morning for her 9am telephone triage.  I was unable to leave a voice message.  I left a sms message with my desk number for her to call back.  Thanks,  [place], [place]

## 2020-06-29 ENCOUNTER — Ambulatory Visit (INDEPENDENT_AMBULATORY_CARE_PROVIDER_SITE_OTHER): Payer: Managed Care, Other (non HMO) | Admitting: Internal Medicine

## 2020-06-29 ENCOUNTER — Encounter: Payer: Self-pay | Admitting: Internal Medicine

## 2020-06-29 ENCOUNTER — Other Ambulatory Visit: Payer: Self-pay | Admitting: Internal Medicine

## 2020-06-29 ENCOUNTER — Other Ambulatory Visit: Payer: Self-pay

## 2020-06-29 VITALS — BP 158/90 | HR 112 | Temp 98.9°F | Resp 18 | Ht 65.5 in | Wt 210.4 lb

## 2020-06-29 DIAGNOSIS — N951 Menopausal and female climacteric states: Secondary | ICD-10-CM | POA: Diagnosis not present

## 2020-06-29 DIAGNOSIS — I10 Essential (primary) hypertension: Secondary | ICD-10-CM

## 2020-06-29 MED ORDER — LOSARTAN POTASSIUM-HCTZ 100-25 MG PO TABS: 1.0000 | ORAL_TABLET | Freq: Every day | ORAL | 3 refills | Status: DC

## 2020-06-29 NOTE — Progress Notes (Signed)
   Subjective:   Patient ID: Lori Levy, female    DOB: 1966/04/16, 55 y.o.   MRN: [id number]  HPI The patient is a 55 YO female coming in new for ER follow up and concerns about BP. She has been found to have high BP recently. BP 190/120 at ob/gyn and then went to urgent care and ER with similarly elevated readings. She has not been feeling particularly bad in the recent past. She has been having hot flashes. Her ob/gyn would not treat her for hot flashes until her BP was under control. Denies headaches or chest pains. Just gets heat flashes with the hot flash which she can feel starting in her chest. Denies heart racing. No EKG done at the ER. They did labs on her which were fairly normal. Mother with high BP, no siblings. She was started on hctz 25 mg daily by ER about 4-5 days ago and has been taking since that time. No side effects, still having higher readings. She denies lightheadedness but a little off since then.   PMH, [facility], social history reviewed and updated  Review of Systems  Constitutional: Negative.   HENT: Negative.   Eyes: Negative.   Respiratory: Negative for cough, chest tightness and shortness of breath.   Cardiovascular: Negative for chest pain, palpitations and leg swelling.  Gastrointestinal: Negative for abdominal distention, abdominal pain, constipation, diarrhea, nausea and vomiting.  Endocrine: Positive for heat intolerance.  Musculoskeletal: Negative.   Skin: Negative.   Neurological: Negative.   Psychiatric/Behavioral: Negative.     Objective:  Physical Exam Constitutional:      Appearance: She is well-developed and well-nourished.  HENT:     Head: Normocephalic and atraumatic.  Eyes:     Extraocular Movements: EOM normal.  Cardiovascular:     Rate and Rhythm: Normal rate and regular rhythm.  Pulmonary:     Effort: Pulmonary effort is normal. No respiratory distress.     Breath sounds: Normal breath sounds. No wheezing or rales.  Abdominal:      General: Bowel sounds are normal. There is no distension.     Palpations: Abdomen is soft.     Tenderness: There is no abdominal tenderness. There is no rebound.  Musculoskeletal:        General: No edema.     Cervical back: Normal range of motion.  Skin:    General: Skin is warm and dry.  Neurological:     Mental Status: She is alert and oriented to person, place, and time.     Coordination: Coordination normal.  Psychiatric:        Mood and Affect: Mood and affect normal.     Vitals:   06/29/20 1545  BP: (!) 158/90  Pulse: (!) 112  Resp: 18  Temp: 98.9 F (37.2 C)  TempSrc: Oral  SpO2: 97%  Weight: 210 lb 6.4 oz (95.4 kg)  Height: 5' 5.5" (1.664 m)   EKG: Rate 102, axis normal, intervals normal, sinus, no st or t wave changes, no prior to compare  This visit occurred during the SARS-CoV-2 public health emergency.  Safety protocols were in place, including screening questions prior to the visit, additional usage of staff PPE, and extensive cleaning of exam room while observing appropriate contact time as indicated for disinfecting solutions.   Assessment & Plan:

## 2020-06-29 NOTE — Patient Instructions (Addendum)
We have done the EKG today and it is normal. There are no signs that the blood pressure has been up for a long time.   We have sent in losartan/hctz to take 1 pill daily.   Saxenda or wegovy are the two weight loss medicines.   DASH Eating Plan DASH stands for Dietary Approaches to Stop Hypertension. The DASH eating plan is a healthy eating plan that has been shown to:  Reduce high blood pressure (hypertension).  Reduce your risk for type 2 diabetes, heart disease, and stroke.  Help with weight loss. What are tips for following this plan? Reading food labels  Check food labels for the amount of salt (sodium) per serving. Choose foods with less than 5 percent of the Daily Value of sodium. Generally, foods with less than 300 milligrams (mg) of sodium per serving fit into this eating plan.  To find whole grains, look for the word "whole" as the first word in the ingredient list. Shopping  Buy products labeled as "low-sodium" or "no salt added."  Buy fresh foods. Avoid canned foods and pre-made or frozen meals. Cooking  Avoid adding salt when cooking. Use salt-free seasonings or herbs instead of table salt or sea salt. Check with your health care provider or pharmacist before using salt substitutes.  Do not fry foods. Cook foods using healthy methods such as baking, boiling, grilling, roasting, and broiling instead.  Cook with heart-healthy oils, such as olive, canola, avocado, soybean, or sunflower oil. Meal planning  Eat a balanced diet that includes: ? 4 or more servings of fruits and 4 or more servings of vegetables each day. Try to fill one-half of your plate with fruits and vegetables. ? 6-8 servings of whole grains each day. ? Less than 6 oz (170 g) of lean meat, poultry, or fish each day. A 3-oz (85-g) serving of meat is about the same size as a deck of cards. One egg equals 1 oz (28 g). ? 2-3 servings of low-fat dairy each day. One serving is 1 cup (237 mL). ? 1 serving of  nuts, seeds, or beans 5 times each week. ? 2-3 servings of heart-healthy fats. Healthy fats called omega-3 fatty acids are found in foods such as walnuts, flaxseeds, fortified milks, and eggs. These fats are also found in cold-water fish, such as sardines, salmon, and mackerel.  Limit how much you eat of: ? Canned or prepackaged foods. ? Food that is high in trans fat, such as some fried foods. ? Food that is high in saturated fat, such as fatty meat. ? Desserts and other sweets, sugary drinks, and other foods with added sugar. ? Full-fat dairy products.  Do not salt foods before eating.  Do not eat more than 4 egg yolks a week.  Try to eat at least 2 vegetarian meals a week.  Eat more home-cooked food and less restaurant, buffet, and fast food.   Lifestyle  When eating at a restaurant, ask that your food be prepared with less salt or no salt, if possible.  If you drink alcohol: ? Limit how much you use to:  0-1 drink a day for women who are not pregnant.  0-2 drinks a day for men. ? Be aware of how much alcohol is in your drink. In the [place] one drink equals one 12 oz bottle of beer (355 mL), one 5 oz glass of wine (148 mL), or one 1 oz glass of hard liquor (44 mL). General information  Avoid eating more  than 2,300 mg of salt a day. If you have hypertension, you may need to reduce your sodium intake to 1,500 mg a day.  Work with your health care provider to maintain a healthy body weight or to lose weight. Ask what an ideal weight is for you.  Get at least 30 minutes of exercise that causes your heart to beat faster (aerobic exercise) most days of the week. Activities may include walking, swimming, or biking.  Work with your health care provider or dietitian to adjust your eating plan to your individual calorie needs. What foods should I eat? Fruits All fresh, dried, or frozen fruit. Canned fruit in natural juice (without added sugar). Vegetables Fresh or frozen vegetables  (raw, steamed, roasted, or grilled). Low-sodium or reduced-sodium tomato and vegetable juice. Low-sodium or reduced-sodium tomato sauce and tomato paste. Low-sodium or reduced-sodium canned vegetables. Grains Whole-grain or whole-wheat bread. Whole-grain or whole-wheat pasta. Brown rice. Orpah Cobb. Bulgur. Whole-grain and low-sodium cereals. Pita bread. Low-fat, low-sodium crackers. Whole-wheat flour tortillas. Meats and other proteins Skinless chicken or [place]. Ground chicken or [place]. Pork with fat trimmed off. Fish and seafood. Egg whites. Dried beans, peas, or lentils. Unsalted nuts, nut butters, and seeds. Unsalted canned beans. Lean cuts of beef with fat trimmed off. Low-sodium, lean precooked or cured meat, such as sausages or meat loaves. Dairy Low-fat (1%) or fat-free (skim) milk. Reduced-fat, low-fat, or fat-free cheeses. Nonfat, low-sodium ricotta or cottage cheese. Low-fat or nonfat yogurt. Low-fat, low-sodium cheese. Fats and oils Soft margarine without trans fats. Vegetable oil. Reduced-fat, low-fat, or light mayonnaise and salad dressings (reduced-sodium). Canola, safflower, olive, avocado, soybean, and sunflower oils. Avocado. Seasonings and condiments Herbs. Spices. Seasoning mixes without salt. Other foods Unsalted popcorn and pretzels. Fat-free sweets. The items listed above may not be a complete list of foods and beverages you can eat. Contact a dietitian for more information. What foods should I avoid? Fruits Canned fruit in a light or heavy syrup. Fried fruit. Fruit in cream or butter sauce. Vegetables Creamed or fried vegetables. Vegetables in a cheese sauce. Regular canned vegetables (not low-sodium or reduced-sodium). Regular canned tomato sauce and paste (not low-sodium or reduced-sodium). Regular tomato and vegetable juice (not low-sodium or reduced-sodium). Rosita Fire. Olives. Grains Baked goods made with fat, such as croissants, muffins, or some breads. Dry pasta  or rice meal packs. Meats and other proteins Fatty cuts of meat. Ribs. Fried meat. Tomasa Blase. Bologna, salami, and other precooked or cured meats, such as sausages or meat loaves. Fat from the back of a pig (fatback). Bratwurst. Salted nuts and seeds. Canned beans with added salt. Canned or smoked fish. Whole eggs or egg yolks. Chicken or [place] with skin. Dairy Whole or 2% milk, cream, and half-and-half. Whole or full-fat cream cheese. Whole-fat or sweetened yogurt. Full-fat cheese. Nondairy creamers. Whipped toppings. Processed cheese and cheese spreads. Fats and oils Butter. Stick margarine. Lard. Shortening. Ghee. Bacon fat. Tropical oils, such as coconut, palm kernel, or palm oil. Seasonings and condiments Onion salt, garlic salt, seasoned salt, table salt, and sea salt. Worcestershire sauce. Tartar sauce. Barbecue sauce. Teriyaki sauce. Soy sauce, including reduced-sodium. Steak sauce. Canned and packaged gravies. Fish sauce. Oyster sauce. Cocktail sauce. Store-bought horseradish. Ketchup. Mustard. Meat flavorings and tenderizers. Bouillon cubes. Hot sauces. Pre-made or packaged marinades. Pre-made or packaged taco seasonings. Relishes. Regular salad dressings. Other foods Salted popcorn and pretzels. The items listed above may not be a complete list of foods and beverages you should avoid. Contact a dietitian for  more information. Where to find more information  National Heart, Lung, and Blood Institute: PopSteam.is  American Heart Association: www.heart.org  Academy of Nutrition and Dietetics: www.eatright.org  National Kidney Foundation: www.kidney.org Summary  The DASH eating plan is a healthy eating plan that has been shown to reduce high blood pressure (hypertension). It may also reduce your risk for type 2 diabetes, heart disease, and stroke.  When on the DASH eating plan, aim to eat more fresh fruits and vegetables, whole grains, lean proteins, low-fat dairy, and  heart-healthy fats.  With the DASH eating plan, you should limit salt (sodium) intake to 2,300 mg a day. If you have hypertension, you may need to reduce your sodium intake to 1,500 mg a day.  Work with your health care provider or dietitian to adjust your eating plan to your individual calorie needs. This information is not intended to replace advice given to you by your health care provider. Make sure you discuss any questions you have with your health care provider. Document Revised: 04/18/2019 Document Reviewed: 04/18/2019 Elsevier Patient Education  2021 [employer].

## 2020-06-30 ENCOUNTER — Telehealth: Payer: Self-pay | Admitting: Internal Medicine

## 2020-06-30 MED ORDER — HYDROCHLOROTHIAZIDE 25 MG PO TABS: 25.0000 mg | ORAL_TABLET | Freq: Every day | ORAL | 3 refills | Status: DC

## 2020-06-30 NOTE — Telephone Encounter (Signed)
Patient confused about what medications are being changed and being sent in for her and she thought the hydrochlorothiazide (HYDRODIURIL) 25 MG tablet was being changed but its still the same dosage she had been taking and the pharmacy is refusing to fill the medication. Would like to speak with someone

## 2020-06-30 NOTE — Telephone Encounter (Signed)
See below

## 2020-06-30 NOTE — Telephone Encounter (Signed)
Sent in both scripts

## 2020-07-01 ENCOUNTER — Telehealth: Payer: Self-pay | Admitting: Internal Medicine

## 2020-07-01 ENCOUNTER — Other Ambulatory Visit: Payer: Self-pay

## 2020-07-01 ENCOUNTER — Ambulatory Visit: Payer: Managed Care, Other (non HMO) | Admitting: Internal Medicine

## 2020-07-01 ENCOUNTER — Ambulatory Visit (INDEPENDENT_AMBULATORY_CARE_PROVIDER_SITE_OTHER): Payer: Managed Care, Other (non HMO) | Admitting: Internal Medicine

## 2020-07-01 ENCOUNTER — Encounter: Payer: Self-pay | Admitting: Internal Medicine

## 2020-07-01 DIAGNOSIS — I10 Essential (primary) hypertension: Secondary | ICD-10-CM

## 2020-07-01 DIAGNOSIS — N951 Menopausal and female climacteric states: Secondary | ICD-10-CM

## 2020-07-01 MED ORDER — ESCITALOPRAM OXALATE 10 MG PO TABS: 10.0000 mg | ORAL_TABLET | Freq: Every day | ORAL | 5 refills | Status: DC

## 2020-07-01 NOTE — Telephone Encounter (Signed)
Patient was seen in office today in regards to her blood pressure.

## 2020-07-01 NOTE — Telephone Encounter (Signed)
Patient asking for a call back regarding a question with her blood pressure, would not expand on what the question was.

## 2020-07-01 NOTE — Assessment & Plan Note (Signed)
BP is much closer to goal today but is still elevated. Will change hctz to losartan/hctz. Follow up visit in 2 weeks and check labs at that time. If BP not responding appropriately workup for secondary causes will be initiated at that time. EKG done today without changes of LVH or other making longstanding undetected hypertension less likely.

## 2020-07-01 NOTE — Assessment & Plan Note (Signed)
It is unclear to me if these are related to BP or menopause. She is age appropriate for menopausal symptoms.

## 2020-07-01 NOTE — Progress Notes (Unsigned)
   Subjective:   Patient ID: Lori Levy, female    DOB: 03-31-1966, 55 y.o.   MRN: [id number]  HPI The patient is a 55 YO female coming in for BP follow up. She did switch to losartan 100 mg daily and took this for a day or two. Then got some lightheaded last night with BP down to 100s/60s. She did not take the hctz concurrently. Originally was prescribed losartan/hctz but this was not in stock. She did have 127/88 BP this morning before taking medication. She has been drinking more fluids since last night and eating extra salt. She had reduced salt in her diet recently to help with BP. Denies headaches or chest pains.   Review of Systems  Constitutional: Negative.   HENT: Negative.   Eyes: Negative.   Respiratory: Negative for cough, chest tightness and shortness of breath.   Cardiovascular: Negative for chest pain, palpitations and leg swelling.  Gastrointestinal: Negative for abdominal distention, abdominal pain, constipation, diarrhea, nausea and vomiting.  Musculoskeletal: Negative.   Skin: Negative.   Neurological: Positive for light-headedness.  Psychiatric/Behavioral: Negative.     Objective:  Physical Exam Constitutional:      Appearance: She is well-developed and well-nourished.  HENT:     Head: Normocephalic and atraumatic.  Eyes:     Extraocular Movements: EOM normal.  Cardiovascular:     Rate and Rhythm: Normal rate and regular rhythm.  Pulmonary:     Effort: Pulmonary effort is normal. No respiratory distress.     Breath sounds: Normal breath sounds. No wheezing or rales.  Abdominal:     General: Bowel sounds are normal. There is no distension.     Palpations: Abdomen is soft.     Tenderness: There is no abdominal tenderness. There is no rebound.  Musculoskeletal:        General: No edema.     Cervical back: Normal range of motion.  Skin:    General: Skin is warm and dry.  Neurological:     Mental Status: She is alert and oriented to person, place, and  time.     Coordination: Coordination normal.  Psychiatric:        Mood and Affect: Mood and affect normal.     Vitals:   07/01/20 1415  BP: (!) 124/92  Pulse: (!) 103  Resp: 18  Temp: 98.2 F (36.8 C)  TempSrc: Oral  SpO2: 96%  Weight: 210 lb 6.4 oz (95.4 kg)  Height: 5' 5.5" (1.664 m)    This visit occurred during the SARS-CoV-2 public health emergency.  Safety protocols were in place, including screening questions prior to the visit, additional usage of staff PPE, and extensive cleaning of exam room while observing appropriate contact time as indicated for disinfecting solutions.   Assessment & Plan:

## 2020-07-01 NOTE — Patient Instructions (Addendum)
I would recommend to do 1/2 of the losartan only for now. If the blood pressure goes up add back the hydrochlorothiazide (hctz).  We have sent in the lexapro for the hot flashes to take daily.

## 2020-07-01 NOTE — Telephone Encounter (Signed)
Patient was seen in office today.  

## 2020-07-02 ENCOUNTER — Encounter: Payer: Self-pay | Admitting: Internal Medicine

## 2020-07-02 NOTE — Assessment & Plan Note (Signed)
Rx lexapro 10 mg daily to help with hot flashes.

## 2020-07-02 NOTE — Assessment & Plan Note (Signed)
Will keep losartan 50 mg daily only for now. If BP elevates she will add hctz 25 mg daily and keep losartan 1/2 pill (50 mg) daily. Keep follow up in 2 weeks.

## 2020-07-13 ENCOUNTER — Other Ambulatory Visit: Payer: Self-pay | Admitting: Internal Medicine

## 2020-07-13 ENCOUNTER — Telehealth: Payer: Self-pay | Admitting: Internal Medicine

## 2020-07-13 ENCOUNTER — Other Ambulatory Visit: Payer: Self-pay

## 2020-07-13 ENCOUNTER — Ambulatory Visit (INDEPENDENT_AMBULATORY_CARE_PROVIDER_SITE_OTHER): Payer: Managed Care, Other (non HMO) | Admitting: Internal Medicine

## 2020-07-13 ENCOUNTER — Encounter: Payer: Self-pay | Admitting: Internal Medicine

## 2020-07-13 VITALS — BP 156/96 | HR 80 | Temp 98.3°F | Ht 65.5 in | Wt 216.0 lb

## 2020-07-13 DIAGNOSIS — E6609 Other obesity due to excess calories: Secondary | ICD-10-CM

## 2020-07-13 DIAGNOSIS — N951 Menopausal and female climacteric states: Secondary | ICD-10-CM | POA: Diagnosis not present

## 2020-07-13 DIAGNOSIS — E669 Obesity, unspecified: Secondary | ICD-10-CM | POA: Insufficient documentation

## 2020-07-13 DIAGNOSIS — R739 Hyperglycemia, unspecified: Secondary | ICD-10-CM

## 2020-07-13 DIAGNOSIS — I10 Essential (primary) hypertension: Secondary | ICD-10-CM

## 2020-07-13 DIAGNOSIS — Z6835 Body mass index (BMI) 35.0-35.9, adult: Secondary | ICD-10-CM

## 2020-07-13 LAB — CBC
HCT: 42 % (ref 36.0–46.0)
Hemoglobin: 13.6 g/dL (ref 12.0–15.0)
MCHC: 32.5 g/dL (ref 30.0–36.0)
MCV: 88.2 fl (ref 78.0–100.0)
Platelets: 258 10*3/uL (ref 150.0–400.0)
RBC: 4.77 Mil/uL (ref 3.87–5.11)
RDW: 14.2 % (ref 11.5–15.5)
WBC: 5.7 10*3/uL (ref 4.0–10.5)

## 2020-07-13 LAB — COMPREHENSIVE METABOLIC PANEL
ALT: 17 U/L (ref 0–35)
AST: 19 U/L (ref 0–37)
Albumin: 4.3 g/dL (ref 3.5–5.2)
Alkaline Phosphatase: 92 U/L (ref 39–117)
BUN: 13 mg/dL (ref 6–23)
CO2: 30 mEq/L (ref 19–32)
Calcium: 9.5 mg/dL (ref 8.4–10.5)
Chloride: 101 mEq/L (ref 96–112)
Creatinine, Ser: 0.8 mg/dL (ref 0.40–1.20)
GFR: 84.58 mL/min (ref >60.00)
Glucose, Bld: 121 mg/dL — ABNORMAL HIGH (ref 70–99)
Potassium: 4.2 mEq/L (ref 3.5–5.1)
Sodium: 136 mEq/L (ref 135–145)
Total Bilirubin: 0.5 mg/dL (ref 0.2–1.2)
Total Protein: 8 g/dL (ref 6.0–8.3)

## 2020-07-13 LAB — HEMOGLOBIN A1C: Hgb A1c MFr Bld: 6.6 % — ABNORMAL HIGH (ref 4.6–6.5)

## 2020-07-13 MED ORDER — SEMAGLUTIDE-WEIGHT MANAGEMENT 1.7 MG/0.75ML ~~LOC~~ SOAJ: 1.7000 mg | SUBCUTANEOUS | 0 refills | Status: DC

## 2020-07-13 MED ORDER — SEMAGLUTIDE-WEIGHT MANAGEMENT 1 MG/0.5ML ~~LOC~~ SOAJ: 1.0000 mg | SUBCUTANEOUS | 0 refills | Status: DC

## 2020-07-13 MED ORDER — SEMAGLUTIDE-WEIGHT MANAGEMENT 2.4 MG/0.75ML ~~LOC~~ SOAJ: 2.4000 mg | SUBCUTANEOUS | 0 refills | Status: DC

## 2020-07-13 MED ORDER — SEMAGLUTIDE-WEIGHT MANAGEMENT 0.5 MG/0.5ML ~~LOC~~ SOAJ: 0.5000 mg | SUBCUTANEOUS | 0 refills | Status: DC

## 2020-07-13 MED ORDER — SEMAGLUTIDE-WEIGHT MANAGEMENT 0.25 MG/0.5ML ~~LOC~~ SOAJ: 0.2500 mg | SUBCUTANEOUS | 0 refills | Status: DC

## 2020-07-13 NOTE — Addendum Note (Signed)
Addended by: Ebony Cargo on: 07/13/2020 01:20 PM   Modules accepted: Orders

## 2020-07-13 NOTE — Telephone Encounter (Signed)
See below

## 2020-07-13 NOTE — Patient Instructions (Addendum)
We have sent in wegovy to try weekly for the weight loss.   1st month 0.25 mg weekly  2nd month 0.5 mg weekly  3rd month 1 mg weekly  4th month 1.7 mg weekly  5th month and on 2.4 mg weekly

## 2020-07-13 NOTE — Assessment & Plan Note (Signed)
Checking CBC and CMP today and adjust as needed. Taking losartan 50 mg daily and BP still above goal with most readings at home mid 140s/high 80s. She would like to hold for now and work with weight loss and diet and continue losartan 50 mg daily.

## 2020-07-13 NOTE — Telephone Encounter (Signed)
Semaglutide-Weight Management 0.25 MG/0.5ML SOAJ Patient went to the pharmacy to pick the five medications with this name and each one was going to be 1800 and she is unable to afford that

## 2020-07-13 NOTE — Assessment & Plan Note (Signed)
Continue lexapro 10 mg daily and this is working well. Advised that full effect can take 1-2 months.

## 2020-07-13 NOTE — Assessment & Plan Note (Signed)
Rx wegovy titration schedule and given instructions. Follow up 3 months to assess efficacy and need for full titration.

## 2020-07-13 NOTE — Progress Notes (Signed)
   Subjective:   Patient ID: Lori Levy, female    DOB: Sep 26, 1965, 55 y.o.   MRN: [id number]  HPI The patient is a 55 YO female coming in for follow up hot flashes (started lexapro for this about 2 weeks ago and is noticing benefit, down to 1-2 episodes per day and less severe, previously 10-20 episodes per day, denies side effects) and weight (has checked and would like to do shots for weight loss, is working on diet as well) and blood pressure (taking losartan 50 mg daily for several weeks now, most BP at home 140s/80-90, denies lightheadedness, denies chest pains or headaches, overall feels good with this, trying to avoid salt, does notice some swelling if she eats too much salt).   Review of Systems  Constitutional: Negative.   HENT: Negative.   Eyes: Negative.   Respiratory: Negative for cough, chest tightness and shortness of breath.   Cardiovascular: Negative for chest pain, palpitations and leg swelling.  Gastrointestinal: Negative for abdominal distention, abdominal pain, constipation, diarrhea, nausea and vomiting.  Endocrine: Positive for heat intolerance.  Musculoskeletal: Negative.   Skin: Negative.   Neurological: Negative.   Psychiatric/Behavioral: Negative.     Objective:  Physical Exam Constitutional:      Appearance: She is well-developed and well-nourished.  HENT:     Head: Normocephalic and atraumatic.  Eyes:     Extraocular Movements: EOM normal.  Cardiovascular:     Rate and Rhythm: Normal rate and regular rhythm.  Pulmonary:     Effort: Pulmonary effort is normal. No respiratory distress.     Breath sounds: Normal breath sounds. No wheezing or rales.  Abdominal:     General: Bowel sounds are normal. There is no distension.     Palpations: Abdomen is soft.     Tenderness: There is no abdominal tenderness. There is no rebound.  Musculoskeletal:        General: No edema.     Cervical back: Normal range of motion.  Skin:    General: Skin is warm and  dry.  Neurological:     Mental Status: She is alert and oriented to person, place, and time.     Coordination: Coordination normal.  Psychiatric:        Mood and Affect: Mood and affect normal.     Vitals:   07/13/20 0900  BP: (!) 156/96  Pulse: 80  Temp: 98.3 F (36.8 C)  TempSrc: Oral  SpO2: 97%  Weight: 216 lb (98 kg)  Height: 5' 5.5" (1.664 m)    This visit occurred during the SARS-CoV-2 public health emergency.  Safety protocols were in place, including screening questions prior to the visit, additional usage of staff PPE, and extensive cleaning of exam room while observing appropriate contact time as indicated for disinfecting solutions.   Assessment & Plan:

## 2020-07-14 ENCOUNTER — Ambulatory Visit: Payer: Managed Care, Other (non HMO) | Admitting: Family Medicine

## 2020-07-14 ENCOUNTER — Encounter: Payer: Self-pay | Admitting: Family Medicine

## 2020-07-14 NOTE — Telephone Encounter (Signed)
See below

## 2020-07-14 NOTE — Telephone Encounter (Signed)
We could try saxenda which is the daily option if she wants but depending on coverage most weight loss medications are not going to be less expensive.

## 2020-07-14 NOTE — Telephone Encounter (Signed)
Patient called back again. She is requesting a cheaper alternative due to the high cost.   Requesting a call back at [phone]

## 2020-07-15 NOTE — Telephone Encounter (Signed)
Patient calling back letting us know Rosann Auerbach is sending [place] a prior auth for [place] to fill out for the medication

## 2020-07-15 NOTE — Telephone Encounter (Signed)
Noted  

## 2020-07-15 NOTE — Telephone Encounter (Signed)
Patient calling back being extremely rude, states she is extremely mad no one has called back and wanted to speak with someone because the pharmacy has given her more information then we have. States she will be calling back every hour until she hears from someone.

## 2020-07-15 NOTE — Progress Notes (Signed)
Patient did not keep appointment today. She may call to reschedule.  

## 2020-07-15 NOTE — Telephone Encounter (Signed)
Spoke with the patient and she stated that the pharmacy told her with Ozempic and Saxenda being so new that the prices are going to be expensive. She stated that the pharmacy told her about 2 medications she could try but she was unable to tell me the names of the medications at this time. She is going to call Rosann Auerbach to see what they will cover and give our office a call back.

## 2020-07-26 NOTE — Telephone Encounter (Signed)
Follow up message  Patient requesting call to discuss outcome of prior auth or alternative medication

## 2020-07-29 NOTE — Telephone Encounter (Signed)
Awaiting determination for PA

## 2020-07-30 NOTE — Telephone Encounter (Signed)
Patient requesting status of prior auth

## 2020-07-30 NOTE — Telephone Encounter (Signed)
Prior authorization can take up to 72 hours before a decision is made. Once insurance has made a decision I will update the patient.

## 2020-07-30 NOTE — Telephone Encounter (Signed)
Patient calling back upset because no one has called her, would like someone to call her.

## 2020-08-05 NOTE — Telephone Encounter (Signed)
Resubmitted the PA. Below is what the results was.  Your patient will pay 100% of a discounted price for this medication. Any amount the patient pays will not apply to their deductible or out-of-pocket expenses.    Unable to get in contact with the patient. I will attempt to call back first thing in the morning.

## 2020-08-05 NOTE — Telephone Encounter (Signed)
Follow up message   Patient requesting status of prior auth.  Patient would like a phone call today to know the outcome or next step for prior auth. She is frustrated and would like a response

## 2020-08-10 ENCOUNTER — Ambulatory Visit: Payer: Managed Care, Other (non HMO) | Admitting: Internal Medicine

## 2020-08-12 ENCOUNTER — Other Ambulatory Visit: Payer: Self-pay | Admitting: Internal Medicine

## 2020-08-12 ENCOUNTER — Telehealth: Payer: Self-pay

## 2020-08-12 MED ORDER — OZEMPIC (0.25 OR 0.5 MG/DOSE) 2 MG/1.5ML ~~LOC~~ SOPN: 0.5000 mg | PEN_INJECTOR | SUBCUTANEOUS | 0 refills | Status: DC

## 2020-08-12 MED ORDER — OZEMPIC (1 MG/DOSE) 2 MG/1.5ML ~~LOC~~ SOPN: 1.0000 mg | PEN_INJECTOR | SUBCUTANEOUS | 3 refills | Status: DC

## 2020-08-12 NOTE — Telephone Encounter (Signed)
Sent in ozempic to take 0.5 mg weekly for 1 month, then increase to 1 mg weekly and come back in visit 3 months to assess results. 2 different prescriptions have been sent in, 1 for the first month at 0.5 mg dosing and then a second prescription for the 1 mg dosing.

## 2020-08-12 NOTE — Telephone Encounter (Signed)
Patient has been notified about the medication being sent to the pharmacy. She was very appreciative for the follow up call. No other questions or concerns at this time.

## 2020-08-12 NOTE — Telephone Encounter (Signed)
Patient calling yelling and extremely upset no one has called her about this issue. She states this is going on way too long and she hasn't heard anything from anyone.

## 2020-08-12 NOTE — Telephone Encounter (Addendum)
Spoke with the patient and expressed my apologies for not reaching out to her but I explained that I was submitting her prior authorization to her insurance to get the medication approved. She stated that she would like to try the medication that was discussed during her office visit that diabetics take to lose weight. I let the patient know that I will reach out to her by lunch time or immediately after today. She was very appreciative for the call back.

## 2020-08-12 NOTE — Addendum Note (Signed)
Addended by: Hillard Danker A on: 08/12/2020 10:24 AM   Modules accepted: Orders

## 2020-08-12 NOTE — Telephone Encounter (Signed)
Prior authorization for ozempic has been initiated on covermymeds.  Key: [id number]  Once a decision has been made. I will update accordingly.

## 2020-08-13 NOTE — Telephone Encounter (Signed)
Patient requesting call from CMA today

## 2020-08-13 NOTE — Telephone Encounter (Signed)
Spoke with the patient and advised her that insurance can take up to 72 hours before a decision is made. I told the patient that I would call her Monday morning with an update. She was very appreciative for the call back. No other questions or concerns at this time.

## 2020-08-17 NOTE — Telephone Encounter (Signed)
Spoke with the patient to let her know that her prior authorization is still pending. I advised the patient that I follow up with her if not by the end of the day it would be tomorrow morning. She was very appreciative for the update.

## 2020-08-18 ENCOUNTER — Encounter: Payer: Self-pay | Admitting: Internal Medicine

## 2020-08-18 NOTE — Telephone Encounter (Signed)
Patient called and said that she received a message from [place] that her PA for  ozempic was denied. She is requesting a call back at [phone]. Please advise

## 2020-08-18 NOTE — Telephone Encounter (Signed)
Responded to patient via [employer]. I will call if patient has additional questions.

## 2020-08-18 NOTE — Telephone Encounter (Signed)
Spoke with the patient and she is wanting to know what are next steps will be. She stated that since she was told that she was pre-diabetic that the medication would be approved. I advised the patient that I would send a message to Dr. Okey Dupre and reach out to her as soon as a decision was made. She was very appreciative for the follow up call.

## 2020-08-19 NOTE — Telephone Encounter (Signed)
She can contact her insurance company and check if trulicity, rybelsus, bydureon would be approved these are similar to ozempic. Insurance companies typically have limited formularies so it would be best if she could check on her tier and coverage so that we can get her on the right agent which will be most reasonable.

## 2020-08-20 ENCOUNTER — Other Ambulatory Visit: Payer: Self-pay | Admitting: Internal Medicine

## 2020-08-20 ENCOUNTER — Telehealth: Payer: Self-pay

## 2020-08-20 NOTE — Telephone Encounter (Signed)
Prior authorization for Bydureon has been initiated on covermymeds. Key: [id number] PA Case ID: [id number] ICD 10 Code: E66.9  Your information has been submitted to [facility] and is being reviewed. You may close this dialog, return to your dashboard, and perform other tasks. You will receive an electronic determination in CoverMyMeds within 72-120 hours; you'll also receive a faxed copy. You can see the latest determination by locating this request on your dashboard or reopening this request. If Lori Levy has not responded in 120 hours, contact [employer] at [phone].  Patient has been notified via mychart.

## 2020-08-20 NOTE — Telephone Encounter (Signed)
Gave patient message, she is calling her insurance to see if any of those will be approved and she will give [place] a call back.

## 2020-08-23 NOTE — Telephone Encounter (Signed)
Does she want to take metformin? This is not as effective for weight loss but is more a sugar medication. With her sugar levels she does not truly need a sugar medication.

## 2020-08-26 NOTE — Telephone Encounter (Signed)
Pt called to offer option of OV with PCP to discuss A1c & medication that may be able to help.  Pt verb understanding; appt made for 4/1 at 3p.  Assured pt that after RN reviewed chart assistant & PCP is working hard to help her with her requests & that it is not necessary to contact us multiple times per day.  Pt verb understanding.

## 2020-08-27 ENCOUNTER — Ambulatory Visit (INDEPENDENT_AMBULATORY_CARE_PROVIDER_SITE_OTHER): Payer: Managed Care, Other (non HMO) | Admitting: Internal Medicine

## 2020-08-27 ENCOUNTER — Encounter: Payer: Self-pay | Admitting: Internal Medicine

## 2020-08-27 ENCOUNTER — Other Ambulatory Visit: Payer: Self-pay

## 2020-08-27 DIAGNOSIS — R7303 Prediabetes: Secondary | ICD-10-CM | POA: Diagnosis not present

## 2020-08-27 MED ORDER — TRIAMCINOLONE ACETONIDE 0.1 % EX CREA: 1.0000 "application " | TOPICAL_CREAM | Freq: Two times a day (BID) | CUTANEOUS | 2 refills | Status: AC

## 2020-08-27 MED ORDER — RETINOL MOLECULAR FILM 0.3 % OIL: TOPICAL_OIL | 11 refills | Status: AC

## 2020-08-27 MED ORDER — METFORMIN HCL ER 500 MG PO TB24: 500.0000 mg | ORAL_TABLET | Freq: Every day | ORAL | 1 refills | Status: DC

## 2020-08-27 NOTE — Assessment & Plan Note (Signed)
Rx metformin 500 mg daily and return 3-6 months.

## 2020-08-27 NOTE — Progress Notes (Signed)
   Subjective:   Patient ID: Lori Levy, female    DOB: 1965/10/16, 55 y.o.   MRN: [id number]  HPI The patient is a 55 YO female coming in for follow up pre-diabetes. She would like to start metformin for weight loss and insulin resistance. She has tried editing diet and getting more active in the last month without much success on weight loss. She does want to prevent progression to diabetes. Denies chest pains, increased thirst or urination.   Review of Systems  Constitutional: Negative.   HENT: Negative.   Eyes: Negative.   Respiratory: Negative for cough, chest tightness and shortness of breath.   Cardiovascular: Negative for chest pain, palpitations and leg swelling.  Gastrointestinal: Negative for abdominal distention, abdominal pain, constipation, diarrhea, nausea and vomiting.  Endocrine: Negative.   Musculoskeletal: Negative.   Skin: Negative.   Neurological: Negative.   Psychiatric/Behavioral: Negative.     Objective:  Physical Exam Constitutional:      Appearance: She is well-developed.  HENT:     Head: Normocephalic and atraumatic.  Cardiovascular:     Rate and Rhythm: Normal rate and regular rhythm.  Pulmonary:     Effort: Pulmonary effort is normal. No respiratory distress.     Breath sounds: Normal breath sounds. No wheezing or rales.  Abdominal:     General: Bowel sounds are normal. There is no distension.     Palpations: Abdomen is soft.     Tenderness: There is no abdominal tenderness. There is no rebound.  Musculoskeletal:     Cervical back: Normal range of motion.  Skin:    General: Skin is warm and dry.  Neurological:     Mental Status: She is alert and oriented to person, place, and time.     Coordination: Coordination normal.     Vitals:   08/27/20 1507  BP: 128/82  Pulse: 96  Resp: 18  Temp: 98.2 F (36.8 C)  TempSrc: Oral  SpO2: 96%  Height: 5' 5.5" (1.664 m)    This visit occurred during the SARS-CoV-2 public health emergency.   Safety protocols were in place, including screening questions prior to the visit, additional usage of staff PPE, and extensive cleaning of exam room while observing appropriate contact time as indicated for disinfecting solutions.   Assessment & Plan:

## 2020-08-27 NOTE — Patient Instructions (Addendum)
We have sent in the metformin to take 1 pill daily.  Try a magnesium daily.   We have sent in the vitamin A oil for the leg to use twice a day.   We have sent in triamcinolone ointment to use twice a day for the eczema (do not use on face for more than 1 week at a time with 3 week break).

## 2020-09-06 MED ORDER — TRETINOIN 0.1 % EX CREA: TOPICAL_CREAM | Freq: Every day | CUTANEOUS | 0 refills | Status: AC

## 2020-09-06 NOTE — Addendum Note (Signed)
Addended by: Hillard Danker A on: 09/06/2020 02:46 PM   Modules accepted: Orders

## 2020-09-16 NOTE — Telephone Encounter (Signed)
Dx code that should be used for this PA. Is it eczema? Please advise

## 2020-09-20 ENCOUNTER — Other Ambulatory Visit: Payer: Self-pay | Admitting: Internal Medicine

## 2020-10-06 ENCOUNTER — Encounter: Payer: Self-pay | Admitting: Internal Medicine

## 2020-11-08 ENCOUNTER — Ambulatory Visit: Payer: Managed Care, Other (non HMO)

## 2020-11-13 ENCOUNTER — Ambulatory Visit: Payer: Managed Care, Other (non HMO)

## 2020-11-26 ENCOUNTER — Telehealth: Payer: Self-pay | Admitting: Internal Medicine

## 2020-11-26 ENCOUNTER — Other Ambulatory Visit: Payer: Self-pay

## 2020-11-26 ENCOUNTER — Emergency Department
Admission: EM | Admit: 2020-11-26 | Discharge: 2020-11-26 | Disposition: A | Discharge status: Home / Self Care | Payer: Managed Care, Other (non HMO) | Attending: Emergency Medicine | Admitting: Emergency Medicine

## 2020-11-26 DIAGNOSIS — R519 Headache, unspecified: Secondary | ICD-10-CM | POA: Diagnosis present

## 2020-11-26 DIAGNOSIS — Z79899 Other long term (current) drug therapy: Secondary | ICD-10-CM | POA: Insufficient documentation

## 2020-11-26 DIAGNOSIS — I160 Hypertensive urgency: Secondary | ICD-10-CM | POA: Insufficient documentation

## 2020-11-26 LAB — COMPREHENSIVE METABOLIC PANEL
ALT: 14 U/L (ref 0–44)
AST: 20 U/L (ref 15–41)
Albumin: 4.8 g/dL (ref 3.5–5.0)
Alkaline Phosphatase: 80 U/L (ref 38–126)
Anion gap: 10 (ref 5–15)
BUN: 17 mg/dL (ref 6–20)
CO2: 27 mmol/L (ref 22–32)
Calcium: 9.8 mg/dL (ref 8.9–10.3)
Chloride: 96 mmol/L — ABNORMAL LOW (ref 98–111)
Creatinine, Ser: 0.94 mg/dL (ref 0.44–1.00)
GFR, Estimated: >60 mL/min (ref >60)
Glucose, Bld: 129 mg/dL — ABNORMAL HIGH (ref 70–99)
Potassium: 3.6 mmol/L (ref 3.5–5.1)
Sodium: 133 mmol/L — ABNORMAL LOW (ref 135–145)
Total Bilirubin: 1 mg/dL (ref 0.3–1.2)
Total Protein: 9 g/dL — ABNORMAL HIGH (ref 6.5–8.1)

## 2020-11-26 LAB — TROPONIN I (HIGH SENSITIVITY): Troponin I (High Sensitivity): 4 ng/L (ref <18)

## 2020-11-26 LAB — CBC WITH DIFFERENTIAL/PLATELET
Abs Immature Granulocytes: 0.01 10*3/uL (ref 0.00–0.07)
Basophils Absolute: 0.1 10*3/uL (ref 0.0–0.1)
Basophils Relative: 1 %
Eosinophils Absolute: 0 10*3/uL (ref 0.0–0.5)
Eosinophils Relative: 0 %
HCT: 46.2 % — ABNORMAL HIGH (ref 36.0–46.0)
Hemoglobin: 15.5 g/dL — ABNORMAL HIGH (ref 12.0–15.0)
Immature Granulocytes: 0 %
Lymphocytes Relative: 30 %
Lymphs Abs: 1.8 10*3/uL (ref 0.7–4.0)
MCH: 29.4 pg (ref 26.0–34.0)
MCHC: 33.5 g/dL (ref 30.0–36.0)
MCV: 87.5 fL (ref 80.0–100.0)
Monocytes Absolute: 0.5 10*3/uL (ref 0.1–1.0)
Monocytes Relative: 8 %
Neutro Abs: 3.7 10*3/uL (ref 1.7–7.7)
Neutrophils Relative %: 61 %
Platelets: 262 10*3/uL (ref 150–400)
RBC: 5.28 MIL/uL — ABNORMAL HIGH (ref 3.87–5.11)
RDW: 13.9 % (ref 11.5–15.5)
WBC: 6 10*3/uL (ref 4.0–10.5)
nRBC: 0 % (ref 0.0–0.2)

## 2020-11-26 MED ORDER — CLONIDINE HCL 0.1 MG PO TABS
0.2000 mg | ORAL_TABLET | Freq: Once | ORAL | Status: AC
  Administered 2020-11-26: 0.2 mg via ORAL
  Filled 2020-11-26: qty 2

## 2020-11-26 MED ORDER — AMLODIPINE BESYLATE 10 MG PO TABS: 10.0000 mg | ORAL_TABLET | Freq: Every day | ORAL | 0 refills | Status: DC

## 2020-11-26 MED ORDER — HYDROXYZINE HCL 50 MG PO TABS: 50.0000 mg | ORAL_TABLET | Freq: Three times a day (TID) | ORAL | 0 refills | Status: DC | PRN

## 2020-11-26 MED ORDER — ACETAMINOPHEN 325 MG PO TABS
650.0000 mg | ORAL_TABLET | Freq: Once | ORAL | Status: AC
  Administered 2020-11-26: 650 mg via ORAL
  Filled 2020-11-26: qty 2

## 2020-11-26 MED ORDER — HYDROXYZINE HCL 50 MG PO TABS
50.0000 mg | ORAL_TABLET | Freq: Once | ORAL | Status: AC
  Administered 2020-11-26: 50 mg via ORAL
  Filled 2020-11-26: qty 1

## 2020-11-26 NOTE — Telephone Encounter (Signed)
   Patient calling to report most recent BP 170/110 with a headache and numbness right finger  Call transferred  to [facility]

## 2020-11-26 NOTE — ED Triage Notes (Addendum)
Pt comes with c/o HTN. Pt states BP was 158/89. Pt states she took again about hour ago and it was 179/110. Pt states she takes BP meds and took them today.  Pt states numbness in pinkie, headache. Pt denies any blurry vision.  Pt very tearful in triage. Pt states this is worrying her. Pt states her husband is out of town and that makes her nervous.

## 2020-11-26 NOTE — ED Provider Notes (Signed)
[facility] Emergency Department Provider Note  ____________________________________________  Time seen: Approximately 6:26 PM  I have reviewed the triage vital signs and the nursing notes.   HISTORY  Chief Complaint Hypertension    HPI Lori Levy is a 55 y.o. female who presents the emergency department complaining of hypertension.  Patient states that she has a history of same and has been under increased stress recently.  Patient states that she has been checking her blood pressures and has noticed that they have been climbing.  She has been taking her prescribed medication while missing any doses.  Currently she has a headache, feels like her heart is racing.  She states that she has had some increased social stress as family is coming into town for the holiday and her husband has been out of town for an extended period of time.  Patient has similar symptoms several months ago with a similar occurrence.  Patient denies any visual acuity changes, chest pain, shortness of breath, abdominal pain, nausea or vomiting.  Patient takes HCTZ for her blood pressure       Past Medical History:  Diagnosis Date   Amenorrhea    Anemia    Hypertension    Menorrhagia    Migraine     Patient Active Problem List   Diagnosis Date Noted   Pre-diabetes 08/27/2020   Obesity 07/13/2020   Hot flashes due to menopause 06/22/2020   Hypertension     Past Surgical History:  Procedure Laterality Date   ENDOMETRIAL ABLATION  2011   VAGINAL HYSTERECTOMY  2012    Prior to Admission medications   Medication Sig Start Date End Date Taking? Authorizing Provider  amLODipine (NORVASC) 10 MG tablet Take 1 tablet (10 mg total) by mouth daily. 11/26/20 11/26/21 Yes Wren Gallaga, Delorise Royals, PA-C  hydrOXYzine (ATARAX/VISTARIL) 50 MG tablet Take 1 tablet (50 mg total) by mouth 3 (three) times daily as needed for anxiety. 11/26/20  Yes Kaytelynn Scripter, Delorise Royals, PA-C  escitalopram (LEXAPRO)  10 MG tablet Take 1 tablet (10 mg total) by mouth daily. 07/01/20   Myrlene Broker, MD  losartan (COZAAR) 100 MG tablet Take 1 tablet (100 mg total) by mouth daily. 06/30/20   Myrlene Broker, MD  metFORMIN (GLUCOPHAGE XR) 500 MG 24 hr tablet Take 1 tablet (500 mg total) by mouth daily with breakfast. 08/27/20   Myrlene Broker, MD  Multiple Vitamins-Minerals (MULTIVITAMIN WITH MINERALS) tablet Take 1 tablet by mouth daily.    [provider]  tretinoin (RETIN-A) 0.1 % cream Apply topically at bedtime. 09/06/20   Myrlene Broker, MD  triamcinolone (KENALOG) 0.1 % Apply 1 application topically 2 (two) times daily. 08/27/20   Myrlene Broker, MD  Vitamin A (RETINOL MOLECULAR FILM) 0.3 % OIL Use topically on leg twice a day. 08/27/20   Myrlene Broker, MD  vitamin B-12 (CYANOCOBALAMIN) 1000 MCG tablet Take 1,000 mcg by mouth daily.    [provider]    Allergies Clarithromycin and Clarithromycin  No family history on file.  Social History Social History   Tobacco Use   Smoking status: Never   Smokeless tobacco: Never  Vaping Use   Vaping Use: Never used  Substance Use Topics   Alcohol use: Yes    Comment: OCCASIONALLY/SR     Review of Systems  Constitutional: No fever/chills.  Increased stress and increasing blood pressure Eyes: No visual changes. No discharge ENT: No upper respiratory complaints. Cardiovascular: no chest pain. Respiratory:  no cough. No SOB. Gastrointestinal: No abdominal pain.  No nausea, no vomiting.  No diarrhea.  No constipation. Genitourinary: Negative for dysuria. No hematuria Musculoskeletal: Negative for musculoskeletal pain. Skin: Negative for rash, abrasions, lacerations, ecchymosis. Neurological: Negative for headaches, focal weakness or numbness.  10 System ROS otherwise negative.  ____________________________________________   PHYSICAL EXAM:  VITAL SIGNS: ED Triage Vitals  Enc Vitals Group      BP 11/26/20 1728 (!) 192/99     Pulse Rate 11/26/20 1728 (!) 105     Resp 11/26/20 1728 18     Temp 11/26/20 1728 98.5 F (36.9 C)     Temp Source 11/26/20 1728 Oral     SpO2 11/26/20 1728 100 %     Weight --      Height --      Head Circumference --      Peak Flow --      Pain Score 11/26/20 1731 7     Pain Loc --      Pain Edu? --      Excl. in GC? --      Constitutional: Alert and oriented. Well appearing and in no acute distress. Eyes: Conjunctivae are normal. PERRL. EOMI. Head: Atraumatic. ENT:      Ears:       Nose: No congestion/rhinnorhea.      Mouth/Throat: Mucous membranes are moist.  Neck: No stridor.   Hematological/Lymphatic/Immunilogical: No cervical lymphadenopathy. Cardiovascular: Normal rate, regular rhythm. Normal S1 and S2.  Good peripheral circulation. Respiratory: Normal respiratory effort without tachypnea or retractions. Lungs CTAB. Good air entry to the bases with no decreased or absent breath sounds. Gastrointestinal: Bowel sounds 4 quadrants. Soft and nontender to palpation. No guarding or rigidity. No palpable masses. No distention. No CVA tenderness. Musculoskeletal: Full range of motion to all extremities. No gross deformities appreciated. Neurologic:  Normal speech and language. No gross focal neurologic deficits are appreciated.  Skin:  Skin is warm, dry and intact. No rash noted. Psychiatric: Mood and affect are normal. Speech and behavior are normal. Patient exhibits appropriate insight and judgement.   ____________________________________________   LABS (all labs ordered are listed, but only abnormal results are displayed)  Labs Reviewed  COMPREHENSIVE METABOLIC PANEL - Abnormal; Notable for the following components:      Result Value   Sodium 133 (*)    Chloride 96 (*)    Glucose, Bld 129 (*)    Total Protein 9.0 (*)    All other components within normal limits  CBC WITH DIFFERENTIAL/PLATELET - Abnormal; Notable for the following  components:   RBC 5.28 (*)    Hemoglobin 15.5 (*)    HCT 46.2 (*)    All other components within normal limits  TROPONIN I (HIGH SENSITIVITY)  TROPONIN I (HIGH SENSITIVITY)   ____________________________________________  EKG   ____________________________________________  RADIOLOGY   No results found.  ____________________________________________    PROCEDURES  Procedure(s) performed:    Procedures    Medications  cloNIDine (CATAPRES) tablet 0.2 mg (0.2 mg Oral Given 11/26/20 1856)  acetaminophen (TYLENOL) tablet 650 mg (650 mg Oral Given 11/26/20 1856)     ____________________________________________   INITIAL IMPRESSION / ASSESSMENT AND PLAN / ED COURSE  Pertinent labs & imaging results that were available during my care of the patient were reviewed by me and considered in my medical decision making (see chart for details).  Review of the [place] CSRS was performed in accordance of the [facility] prior to dispensing any controlled drugs.  Clinical Course as of 11/26/20 2107  Fri Nov 26, 2020  1947 [facility]: 29.4 [[place]]    Clinical Course User Index [[place]] Renae Gloss, [place] Neighbor          Patient's diagnosis is consistent with hypertensive urgency.  Patient presented to the emergency department with rising blood pressure.  She states that multiple things have been going on recently with her husband being out of town for a week which is caused her to feel stressed.  Patient states that she is having some family coming to town for the holiday which is also causing some stress.  She started a new weight loss medication and has been going through some of these changes recently as well.  She has been talking with her primary care about her rising blood pressure and has losartan for her daily med, HCTZ for as needed medicines.  Patient states that even with the medicine she has been running in the 160s or 170s systolic.  She arrived here with initial blood pressure of 192/99.  There  was no evidence of endorgan damage on labs or physical exam.  Patient was given clonidine and had good improvement of her blood pressure.  She states that she is currently asymptomatic.  This time I feel that patient is stable for discharge.  I will prescribe the patient as needed amlodipine and a dose of hydralazine for anxiety and sleep aid.  Return precautions discussed with the patient.  Otherwise follow-up primary care..  Patient is given ED precautions to return to the ED for any worsening or new symptoms.     ____________________________________________  FINAL CLINICAL IMPRESSION(S) / ED DIAGNOSES  Final diagnoses:  Hypertensive urgency      NEW MEDICATIONS STARTED DURING THIS VISIT:  ED Discharge Orders          Ordered    amLODipine (NORVASC) 10 MG tablet  Daily        11/26/20 2107    hydrOXYzine (ATARAX/VISTARIL) 50 MG tablet  3 times daily PRN        11/26/20 2107                This chart was dictated using voice recognition software/Dragon. Despite best efforts to proofread, errors can occur which can change the meaning. Any change was purely unintentional.    Racheal Patches, PA-C 11/26/20 2108    Merwyn Katos, MD 11/26/20 [phone]

## 2020-11-26 NOTE — Discharge Instructions (Addendum)
Take the Norvasc if your blood pressure remains 160 or 165 as the top number despite sitting down and rechecking over 30 to 45 minutes.  Take your losartan daily, first that would be to add your HCTZ, and if it still remains elevated then you may add the Norvasc.  I am prescribing a medicine called Atarax.  This may be taken up to 3 times daily for either anxiety or sleep aid.  This will make you drowsy and groggy but will not make you pass out or make you unarousable.  Do not take this medicine and drive within 6 hours.

## 2020-11-30 NOTE — Telephone Encounter (Signed)
[facility] FYI 7.1.22:  ---Caller states they have high blood pressure and headache. 170/110 She is also complaining of numbness in her right finger tip.  Advised to go to ED

## 2020-12-17 ENCOUNTER — Encounter: Payer: Self-pay | Admitting: Internal Medicine

## 2020-12-27 ENCOUNTER — Other Ambulatory Visit: Payer: Self-pay | Admitting: Internal Medicine

## 2021-02-01 ENCOUNTER — Other Ambulatory Visit: Payer: Self-pay

## 2021-02-01 ENCOUNTER — Telehealth (INDEPENDENT_AMBULATORY_CARE_PROVIDER_SITE_OTHER): Payer: Managed Care, Other (non HMO) | Admitting: Internal Medicine

## 2021-02-01 ENCOUNTER — Encounter: Payer: Self-pay | Admitting: Internal Medicine

## 2021-02-01 DIAGNOSIS — Z20822 Contact with and (suspected) exposure to covid-19: Secondary | ICD-10-CM | POA: Diagnosis not present

## 2021-02-01 MED ORDER — BENZONATATE 200 MG PO CAPS: 200.0000 mg | ORAL_CAPSULE | Freq: Three times a day (TID) | ORAL | 0 refills | Status: DC | PRN

## 2021-02-01 MED ORDER — MOLNUPIRAVIR EUA 200MG CAPSULE: 4.0000 | ORAL_CAPSULE | Freq: Two times a day (BID) | ORAL | 0 refills | Status: AC

## 2021-02-01 NOTE — Progress Notes (Signed)
Virtual Visit via Video Note  I connected with Lori Levy on 02/01/21 at  4:00 PM EDT by a video enabled telemedicine application and verified that I am speaking with the correct person using two identifiers.  The patient and the provider were at separate locations throughout the entire encounter. Patient location: home, Provider location: work   I discussed the limitations of evaluation and management by telemedicine and the availability of in person appointments. The patient expressed understanding and agreed to proceed. The patient and the provider were the only parties present for the visit unless noted in HPI below.  History of Present Illness: The patient is a 55 y.o. female with visit for cough and husband recently had covid-19. Last Thursday he was positive. Started with cough and fatigue Saturday. She has been testing every day with home test which is negative.   Observations/Objective: Appearance: normal, breathing appears normal, some coughing during visit, casual grooming, abdomen does not appear distended, throat not visualized well, mental status is A and O times 3  Assessment and Plan: See problem oriented charting  Follow Up Instructions: rx tessalon perles for cough and molnupiravir for most likely covid-19.  I discussed the assessment and treatment plan with the patient. The patient was provided an opportunity to ask questions and all were answered. The patient agreed with the plan and demonstrated an understanding of the instructions.   The patient was advised to call back or seek an in-person evaluation if the symptoms worsen or if the condition fails to improve as anticipated.  Myrlene Broker, MD

## 2021-02-02 DIAGNOSIS — Z20822 Contact with and (suspected) exposure to covid-19: Secondary | ICD-10-CM | POA: Insufficient documentation

## 2021-02-02 DIAGNOSIS — R053 Chronic cough: Secondary | ICD-10-CM | POA: Insufficient documentation

## 2021-02-02 NOTE — Assessment & Plan Note (Signed)
Recent trip to [place] and husband positive. She likely has covid-19 and given blood pressure and age rx molnupiravir to take. Rx tessalon perles for cough. Went over [employer] for quarantine period.

## 2021-02-16 ENCOUNTER — Encounter: Payer: Self-pay | Admitting: Internal Medicine

## 2021-02-21 ENCOUNTER — Other Ambulatory Visit: Payer: Self-pay | Admitting: Internal Medicine

## 2021-03-22 ENCOUNTER — Encounter: Payer: Self-pay | Admitting: Internal Medicine

## 2021-04-26 ENCOUNTER — Telehealth: Payer: Managed Care, Other (non HMO) | Admitting: Physician Assistant

## 2021-04-26 DIAGNOSIS — J020 Streptococcal pharyngitis: Secondary | ICD-10-CM

## 2021-04-26 MED ORDER — AMOXICILLIN 500 MG PO CAPS: 500.0000 mg | ORAL_CAPSULE | Freq: Two times a day (BID) | ORAL | 0 refills | Status: AC

## 2021-04-26 NOTE — Progress Notes (Signed)

## 2021-06-20 ENCOUNTER — Other Ambulatory Visit: Payer: Self-pay | Admitting: Internal Medicine

## 2021-06-22 ENCOUNTER — Other Ambulatory Visit: Payer: Self-pay | Admitting: Internal Medicine

## 2021-06-27 ENCOUNTER — Encounter: Payer: Self-pay | Admitting: Internal Medicine

## 2021-06-27 ENCOUNTER — Other Ambulatory Visit: Payer: Self-pay

## 2021-06-27 ENCOUNTER — Ambulatory Visit (INDEPENDENT_AMBULATORY_CARE_PROVIDER_SITE_OTHER): Payer: Managed Care, Other (non HMO) | Admitting: Internal Medicine

## 2021-06-27 VITALS — BP 124/84 | HR 103 | Resp 18 | Ht 65.5 in | Wt 223.2 lb

## 2021-06-27 DIAGNOSIS — R053 Chronic cough: Secondary | ICD-10-CM

## 2021-06-27 DIAGNOSIS — I10 Essential (primary) hypertension: Secondary | ICD-10-CM

## 2021-06-27 DIAGNOSIS — R7303 Prediabetes: Secondary | ICD-10-CM

## 2021-06-27 LAB — COMPREHENSIVE METABOLIC PANEL
ALT: 20 U/L (ref 0–35)
AST: 25 U/L (ref 0–37)
Albumin: 4.5 g/dL (ref 3.5–5.2)
Alkaline Phosphatase: 91 U/L (ref 39–117)
BUN: 21 mg/dL (ref 6–23)
CO2: 31 mEq/L (ref 19–32)
Calcium: 10.1 mg/dL (ref 8.4–10.5)
Chloride: 97 mEq/L (ref 96–112)
Creatinine, Ser: 0.84 mg/dL (ref 0.40–1.20)
GFR: 79.24 mL/min (ref >60.00)
Glucose, Bld: 102 mg/dL — ABNORMAL HIGH (ref 70–99)
Potassium: 4 mEq/L (ref 3.5–5.1)
Sodium: 137 mEq/L (ref 135–145)
Total Bilirubin: 0.7 mg/dL (ref 0.2–1.2)
Total Protein: 8.2 g/dL (ref 6.0–8.3)

## 2021-06-27 LAB — HEMOGLOBIN A1C: Hgb A1c MFr Bld: 6.6 % — ABNORMAL HIGH (ref 4.6–6.5)

## 2021-06-27 LAB — CBC
HCT: 42.6 % (ref 36.0–46.0)
Hemoglobin: 13.5 g/dL (ref 12.0–15.0)
MCHC: 31.7 g/dL (ref 30.0–36.0)
MCV: 92.9 fl (ref 78.0–100.0)
Platelets: 260 10*3/uL (ref 150.0–400.0)
RBC: 4.59 Mil/uL (ref 3.87–5.11)
RDW: 14.2 % (ref 11.5–15.5)
WBC: 5.2 10*3/uL (ref 4.0–10.5)

## 2021-06-27 LAB — LIPID PANEL
Cholesterol: 238 mg/dL — ABNORMAL HIGH (ref 0–200)
HDL: 88.9 mg/dL (ref >39.00)
LDL Cholesterol: 134 mg/dL — ABNORMAL HIGH (ref 0–99)
NonHDL: 148.81
Total CHOL/HDL Ratio: 3
Triglycerides: 72 mg/dL (ref 0.0–149.0)
VLDL: 14.4 mg/dL (ref 0.0–40.0)

## 2021-06-27 MED ORDER — AMLODIPINE BESYLATE 10 MG PO TABS: 10.0000 mg | ORAL_TABLET | Freq: Every day | ORAL | 3 refills | Status: DC | PRN

## 2021-06-27 MED ORDER — FLUTICASONE PROPIONATE 50 MCG/ACT NA SUSP: 2.0000 | Freq: Every day | NASAL | 6 refills | Status: DC

## 2021-06-27 MED ORDER — BENZONATATE 200 MG PO CAPS: 200.0000 mg | ORAL_CAPSULE | Freq: Three times a day (TID) | ORAL | 3 refills | Status: DC | PRN

## 2021-06-27 NOTE — Patient Instructions (Addendum)
We can have you start taking 2 metformin in the morning instead of 1 pill.   We have sent in flonase to use 2 sprays in the evening in each nostril for the drainage.   We have refilled the cough medicine.

## 2021-06-27 NOTE — Progress Notes (Signed)
° °  Subjective:   Patient ID: Lori Levy, female    DOB: 02-05-1966, 56 y.o.   MRN: [id number]  HPI The patient is a 56 YO female coming in for follow up.   Review of Systems  Constitutional: Negative.   HENT: Negative.    Eyes: Negative.   Respiratory:  Negative for cough, chest tightness and shortness of breath.   Cardiovascular:  Negative for chest pain, palpitations and leg swelling.  Gastrointestinal:  Negative for abdominal distention, abdominal pain, constipation, diarrhea, nausea and vomiting.  Musculoskeletal: Negative.   Skin: Negative.   Neurological: Negative.   Psychiatric/Behavioral: Negative.     Objective:  Physical Exam Constitutional:      Appearance: She is well-developed.  HENT:     Head: Normocephalic and atraumatic.  Cardiovascular:     Rate and Rhythm: Normal rate and regular rhythm.  Pulmonary:     Effort: Pulmonary effort is normal. No respiratory distress.     Breath sounds: Normal breath sounds. No wheezing or rales.  Abdominal:     General: Bowel sounds are normal. There is no distension.     Palpations: Abdomen is soft.     Tenderness: There is no abdominal tenderness. There is no rebound.  Musculoskeletal:     Cervical back: Normal range of motion.  Skin:    General: Skin is warm and dry.  Neurological:     Mental Status: She is alert and oriented to person, place, and time.     Coordination: Coordination normal.    Vitals:   06/27/21 0941  BP: 124/84  Pulse: (!) 103  Resp: 18  SpO2: 98%  Weight: 223 lb 3.2 oz (101.2 kg)  Height: 5' 5.5" (1.664 m)    This visit occurred during the SARS-CoV-2 public health emergency.  Safety protocols were in place, including screening questions prior to the visit, additional usage of staff PPE, and extensive cleaning of exam room while observing appropriate contact time as indicated for disinfecting solutions.   Assessment & Plan:

## 2021-06-28 ENCOUNTER — Other Ambulatory Visit: Payer: Self-pay | Admitting: Internal Medicine

## 2021-06-28 ENCOUNTER — Encounter: Payer: Self-pay | Admitting: Internal Medicine

## 2021-06-28 MED ORDER — PRAVASTATIN SODIUM 20 MG PO TABS: 20.0000 mg | ORAL_TABLET | Freq: Every day | ORAL | 3 refills | Status: DC

## 2021-06-28 NOTE — Assessment & Plan Note (Signed)
BMI 36.5 and complicated by hypertension and pre-diabetes. She is taking metformin depending on labs may increase dose. She did try ozempic for 3 months and did not notice benefit.

## 2021-06-28 NOTE — Assessment & Plan Note (Signed)
Checking HgA1c and adjust as needed. She is taking metformin 500 mg daily and has not noticed significant weight loss with this.

## 2021-06-28 NOTE — Assessment & Plan Note (Signed)
BP at goal on hctz 25 mg daily and losartan 100 mg daily. She has amlodipine 10 mg to use prn only for high BP which she has used rarely. Continue.

## 2021-06-28 NOTE — Assessment & Plan Note (Signed)
Due to nasal drip. Rx flonase and tessalon perles.

## 2021-07-23 ENCOUNTER — Other Ambulatory Visit: Payer: Self-pay | Admitting: Internal Medicine

## 2021-08-05 ENCOUNTER — Other Ambulatory Visit: Payer: Self-pay | Admitting: Internal Medicine

## 2021-08-19 ENCOUNTER — Other Ambulatory Visit: Payer: Self-pay

## 2021-08-19 ENCOUNTER — Other Ambulatory Visit: Payer: Self-pay | Admitting: Internal Medicine

## 2021-08-19 ENCOUNTER — Telehealth: Payer: Self-pay

## 2021-08-19 MED ORDER — HYDROXYZINE HCL 50 MG PO TABS: 50.0000 mg | ORAL_TABLET | Freq: Three times a day (TID) | ORAL | 0 refills | Status: DC | PRN

## 2021-08-19 MED ORDER — LOSARTAN POTASSIUM 100 MG PO TABS: 100.0000 mg | ORAL_TABLET | Freq: Every day | ORAL | 3 refills | Status: DC

## 2021-08-19 MED ORDER — METFORMIN HCL ER 500 MG PO TB24: ORAL_TABLET | ORAL | 1 refills | Status: DC

## 2021-08-19 NOTE — Telephone Encounter (Signed)
Pt is requesting refill on: ?pravastatin (PRAVACHOL) 20 MG tablet ?losartan (COZAAR) 100 MG tablet ?hydrOXYzine (ATARAX/VISTARIL) 50 MG tablet ?escitalopram (LEXAPRO) 10 MG tablet ?metFORMIN (GLUCOPHAGE-XR) 500 MG 24 hr tablet ? ?Pharmacy: ?[id number] - [place], [place] - [address] ? ?LOV 06/27/21 ?ROV Pt will call back and get scheduled ? ?

## 2021-08-23 MED ORDER — LOSARTAN POTASSIUM 100 MG PO TABS: 100.0000 mg | ORAL_TABLET | Freq: Every day | ORAL | 3 refills | Status: DC

## 2021-08-23 MED ORDER — ESCITALOPRAM OXALATE 10 MG PO TABS: 10.0000 mg | ORAL_TABLET | Freq: Every day | ORAL | 1 refills | Status: DC

## 2021-08-23 MED ORDER — METFORMIN HCL ER 500 MG PO TB24: ORAL_TABLET | ORAL | 1 refills | Status: DC

## 2021-08-23 MED ORDER — PRAVASTATIN SODIUM 20 MG PO TABS: 20.0000 mg | ORAL_TABLET | Freq: Every day | ORAL | 3 refills | Status: DC

## 2021-08-23 MED ORDER — HYDROXYZINE HCL 50 MG PO TABS: 50.0000 mg | ORAL_TABLET | Freq: Three times a day (TID) | ORAL | 0 refills | Status: DC | PRN

## 2021-08-23 NOTE — Telephone Encounter (Signed)
Refills have been sent to the pt's pharmacy  

## 2021-08-27 ENCOUNTER — Other Ambulatory Visit: Payer: Self-pay | Admitting: Internal Medicine

## 2021-09-05 ENCOUNTER — Encounter: Payer: Self-pay | Admitting: Internal Medicine

## 2021-10-12 ENCOUNTER — Other Ambulatory Visit: Payer: Self-pay | Admitting: Internal Medicine

## 2021-11-08 ENCOUNTER — Other Ambulatory Visit: Payer: Self-pay | Admitting: Internal Medicine

## 2021-11-10 ENCOUNTER — Telehealth: Payer: Self-pay | Admitting: *Deleted

## 2021-11-10 ENCOUNTER — Other Ambulatory Visit: Payer: Self-pay | Admitting: Internal Medicine

## 2021-11-10 MED ORDER — WEGOVY 2.4 MG/0.75ML ~~LOC~~ SOAJ: SUBCUTANEOUS | 0 refills | Status: DC

## 2021-11-10 NOTE — Telephone Encounter (Signed)
Pt was on cover-my-meds needing PA for [facility]. Submitted PA w/ (Key: [id number]) Rec'd msg stating Your PA request has been sent to [employer]...Raechel Chute

## 2021-11-10 NOTE — Telephone Encounter (Signed)
1ST Transmission failed resent wegovy to pof.Marland KitchenRaechel Chute

## 2021-11-10 NOTE — Addendum Note (Signed)
Addended by: Deatra James on: 11/10/2021 12:44 PM   Modules accepted: Orders

## 2021-11-14 NOTE — Telephone Encounter (Signed)
Rec'd msg"  Your PA request has been approved." [facility]

## 2021-11-16 ENCOUNTER — Other Ambulatory Visit: Payer: Self-pay | Admitting: Internal Medicine

## 2021-11-28 ENCOUNTER — Other Ambulatory Visit: Payer: Self-pay | Admitting: Internal Medicine

## 2021-12-26 ENCOUNTER — Other Ambulatory Visit: Payer: Self-pay | Admitting: Internal Medicine

## 2022-01-30 ENCOUNTER — Other Ambulatory Visit: Payer: Self-pay | Admitting: Internal Medicine

## 2022-01-31 ENCOUNTER — Other Ambulatory Visit: Payer: Self-pay | Admitting: Internal Medicine

## 2022-02-16 ENCOUNTER — Other Ambulatory Visit (INDEPENDENT_AMBULATORY_CARE_PROVIDER_SITE_OTHER): Payer: 59

## 2022-02-16 ENCOUNTER — Ambulatory Visit (INDEPENDENT_AMBULATORY_CARE_PROVIDER_SITE_OTHER): Payer: 59 | Admitting: Nurse Practitioner

## 2022-02-16 ENCOUNTER — Encounter: Payer: Self-pay | Admitting: Nurse Practitioner

## 2022-02-16 VITALS — BP 136/84 | HR 104 | Temp 98.4°F | Ht 65.0 in | Wt 218.0 lb

## 2022-02-16 DIAGNOSIS — Z1231 Encounter for screening mammogram for malignant neoplasm of breast: Secondary | ICD-10-CM

## 2022-02-16 DIAGNOSIS — E119 Type 2 diabetes mellitus without complications: Secondary | ICD-10-CM

## 2022-02-16 DIAGNOSIS — G43119 Migraine with aura, intractable, without status migrainosus: Secondary | ICD-10-CM | POA: Diagnosis not present

## 2022-02-16 DIAGNOSIS — Z23 Encounter for immunization: Secondary | ICD-10-CM

## 2022-02-16 DIAGNOSIS — E118 Type 2 diabetes mellitus with unspecified complications: Secondary | ICD-10-CM | POA: Insufficient documentation

## 2022-02-16 DIAGNOSIS — Z1211 Encounter for screening for malignant neoplasm of colon: Secondary | ICD-10-CM | POA: Diagnosis not present

## 2022-02-16 DIAGNOSIS — Z0000 Encounter for general adult medical examination without abnormal findings: Secondary | ICD-10-CM | POA: Insufficient documentation

## 2022-02-16 LAB — BASIC METABOLIC PANEL
BUN: 17 mg/dL (ref 6–23)
CO2: 33 mEq/L — ABNORMAL HIGH (ref 19–32)
Calcium: 10 mg/dL (ref 8.4–10.5)
Chloride: 97 mEq/L (ref 96–112)
Creatinine, Ser: 1.02 mg/dL (ref 0.40–1.20)
GFR: 62.49 mL/min (ref >60.00)
Glucose, Bld: 104 mg/dL — ABNORMAL HIGH (ref 70–99)
Potassium: 3.5 mEq/L (ref 3.5–5.1)
Sodium: 138 mEq/L (ref 135–145)

## 2022-02-16 MED ORDER — NURTEC 75 MG PO TBDP: 1.0000 | ORAL_TABLET | ORAL | 1 refills | Status: AC | PRN

## 2022-02-16 MED ORDER — TIRZEPATIDE 2.5 MG/0.5ML ~~LOC~~ SOAJ: 2.5000 mg | SUBCUTANEOUS | 0 refills | Status: DC

## 2022-02-16 NOTE — Assessment & Plan Note (Signed)
Per request patient provided with work note for today due to her migraine.  Seems to be improving slowly on its own, will provide patient with samples of Nurtec that she can take as needed for abortive therapy.  Would not recommend triptan due to patient's history of severe antihypertension, unless she is unable to get Nurtec or other abortive therapy approved by insurance.  Encouraged her to discuss this with her PCP upon follow-up to determine if samples assisted with migraine, if so consider prescription.

## 2022-02-16 NOTE — Assessment & Plan Note (Signed)
Flu shot administered today, VIS provided to patient as well.  Recommend she consider shingles vaccine, she reports her understanding and may consider getting this administered at pharmacy.

## 2022-02-16 NOTE — Progress Notes (Signed)
Established Patient Office Visit  Subjective   Patient ID: Lori Levy, female    DOB: 1966-03-02  Age: 56 y.o. MRN: [id number]  Chief Complaint  Patient presents with   Rash    This patient comes in today for acute visit for the above.  She is a patient of Dr. Nathanial Levy.  Rash: Started after she wore a necklace about 2 weeks ago.  Rash was pruritic, seems to be improving.  She ended up removing the necklace and applied tretinoin cream.  Rash is now improved, however she has noticed some darkening of the skin around her neck as well as flakiness.  She has type 2 diabetes and was concerned that this may be a sign that diabetes is worsening.  Type 2 diabetes: Last A1c 6.6, she is on losartan and statin therapy.  She is also on Lori Levy and metformin.  She reports she has tried Lori Levy in the past.  She is concerned that she has not had much weight loss on Lori Levy, would like to discuss changing to Lori Levy.  She denies personal history of thyroid cancer or family history of thyroid cancer.  Migraine: She reports she has a migraine today, and she took off work due to her symptoms.  She reports she gets migraines about once every 6 months, this migraine started yesterday.  Is improving with rest alone.  She reports she cannot tolerate Excedrin.  She has history of severe hypertension and reports that she has had systolic blood pressure of 200 in the past.  Health maintenance: She would also like to discuss recommended immunizations, breast cancer screening with mammogram, and colon cancer screening options.  Patient denies personal history of known colon polyps, has no family history of colon cancer, has never been diagnosed with ulcerative colitis or Crohn's disease.    Review of Systems  Eyes:  Positive for blurred vision.  Gastrointestinal:  Negative for nausea and vomiting.  Neurological:  Positive for headaches.      Objective:     BP 136/84   Pulse (!) 104   Temp 98.4 F (36.9  C) (Oral)   Ht 5\' 5"  (1.651 m)   Wt 218 lb (98.9 kg)   SpO2 94%   BMI 36.28 kg/m  BP Readings from Last 3 Encounters:  02/16/22 136/84  06/27/21 124/84  11/26/20 116/79   Wt Readings from Last 3 Encounters:  02/16/22 218 lb (98.9 kg)  06/27/21 223 lb 3.2 oz (101.2 kg)  07/13/20 216 lb (98 kg)      Physical Exam Vitals reviewed.  Constitutional:      General: She is not in acute distress.    Appearance: Normal appearance.  HENT:     Head: Normocephalic and atraumatic.  Neck:     Vascular: No carotid bruit.  Cardiovascular:     Rate and Rhythm: Normal rate and regular rhythm.     Pulses: Normal pulses.     Heart sounds: Normal heart sounds.  Pulmonary:     Effort: Pulmonary effort is normal.     Breath sounds: Normal breath sounds.  Skin:    General: Skin is warm and dry.          Comments: Green line indicates area of hyperpigmentation with flaking skin.  Neurological:     General: No focal deficit present.     Mental Status: She is alert and oriented to person, place, and time.  Psychiatric:        Mood and Affect: Mood normal.  Behavior: Behavior normal.        Judgment: Judgment normal.      No results found for any visits on 02/16/22.    The 10-year ASCVD risk score (Arnett DK, et al., 2019) is: 9.4%    Assessment & Plan:   Problem List Items Addressed This Visit       Cardiovascular and Mediastinum   Intractable migraine with aura without status migrainosus    Per request patient provided with work note for today due to her migraine.  Seems to be improving slowly on its own, will provide patient with samples of Lori Levy that she can take as needed for abortive therapy.  Would not recommend triptan due to patient's history of severe antihypertension, unless she is unable to get Lori Levy or other abortive therapy approved by insurance.  Encouraged her to discuss this with her Lori Levy upon follow-up to determine if samples assisted with migraine, if so  consider prescription.      Relevant Medications   Rimegepant Sulfate (Lori Levy) 75 MG TBDP     Endocrine   Type 2 diabetes mellitus without complication, without long-term current use of insulin (HCC) - Primary    Patient to continue on losartan and pravastatin.  Also encouraged her to continue on metformin.  Had brief consultation with Lori Levy and we are in agreement to trial Lori Levy, will start lowest dose of 2.5 injection/week.  Patient to follow-up in 1 month to see how she is tolerating it and to consider increasing dose.  We will check A1c today as well.      Relevant Medications   tirzepatide (MOUNJARO) 2.5 MG/0.5ML Pen   Other Relevant Orders   Hemoglobin A1c   Basic metabolic panel     Other   Encounter for screening mammogram for malignant neoplasm of breast    We will order screening mammogram, further recommendations may be made based upon these results.      Relevant Orders   MM DIGITAL SCREENING BILATERAL   Colon cancer screening    Provided colonic cancer screening options, recommended colonoscopy as this is gold standard.  Per shared decision making patient would like to do Cologuard.  Cologuard ordered today. Further recommendations may be made based on these results.         Relevant Orders   Cologuard   Need for vaccination    Flu shot administered today, VIS provided to patient as well.  Recommend she consider shingles vaccine, she reports her understanding and may consider getting this administered at pharmacy.      Relevant Orders   Flu Vaccine QUAD 6+ mos PF IM (Fluarix Quad PF) (Completed)    Return in about 4 weeks (around 03/16/2022) for with Lori Levy for T2DM  Lori Levy.    Elenore Paddy, NP

## 2022-02-16 NOTE — Assessment & Plan Note (Addendum)
We will order screening mammogram, further recommendations may be made based upon these results.

## 2022-02-16 NOTE — Assessment & Plan Note (Addendum)
Provided colonic cancer screening options, recommended colonoscopy as this is gold standard.  Per shared decision making patient would like to do Cologuard.  Cologuard ordered today. Further recommendations may be made based on these results.

## 2022-02-16 NOTE — Assessment & Plan Note (Signed)
Patient to continue on losartan and pravastatin.  Also encouraged her to continue on metformin.  Had brief consultation with PCP and we are in agreement to trial [facility], will start lowest dose of 2.5 injection/week.  Patient to follow-up in 1 month to see how she is tolerating it and to consider increasing dose.  We will check A1c today as well.

## 2022-02-20 LAB — HEMOGLOBIN A1C: Hgb A1c MFr Bld: 6.7 % — ABNORMAL HIGH (ref 4.6–6.5)

## 2022-03-07 ENCOUNTER — Other Ambulatory Visit: Payer: Self-pay | Admitting: Internal Medicine

## 2022-03-07 ENCOUNTER — Other Ambulatory Visit: Payer: Self-pay | Admitting: Nurse Practitioner

## 2022-03-07 DIAGNOSIS — E119 Type 2 diabetes mellitus without complications: Secondary | ICD-10-CM

## 2022-03-16 ENCOUNTER — Ambulatory Visit (INDEPENDENT_AMBULATORY_CARE_PROVIDER_SITE_OTHER): Payer: 59 | Admitting: Internal Medicine

## 2022-03-16 ENCOUNTER — Encounter: Payer: Self-pay | Admitting: Internal Medicine

## 2022-03-16 VITALS — BP 139/89 | HR 90 | Temp 98.5°F | Ht 65.0 in | Wt 223.0 lb

## 2022-03-16 DIAGNOSIS — E118 Type 2 diabetes mellitus with unspecified complications: Secondary | ICD-10-CM | POA: Diagnosis not present

## 2022-03-16 DIAGNOSIS — I10 Essential (primary) hypertension: Secondary | ICD-10-CM

## 2022-03-16 DIAGNOSIS — E785 Hyperlipidemia, unspecified: Secondary | ICD-10-CM

## 2022-03-16 DIAGNOSIS — E1169 Type 2 diabetes mellitus with other specified complication: Secondary | ICD-10-CM

## 2022-03-16 LAB — MICROALBUMIN / CREATININE URINE RATIO
Creatinine,U: 157.8 mg/dL
Microalb Creat Ratio: 2.1 mg/g (ref 0.0–30.0)
Microalb, Ur: 3.3 mg/dL — ABNORMAL HIGH (ref 0.0–1.9)

## 2022-03-16 MED ORDER — TIRZEPATIDE 5 MG/0.5ML ~~LOC~~ SOAJ: 5.0000 mg | SUBCUTANEOUS | 0 refills | Status: DC

## 2022-03-16 MED ORDER — TIRZEPATIDE 7.5 MG/0.5ML ~~LOC~~ SOAJ: 7.5000 mg | SUBCUTANEOUS | 0 refills | Status: DC

## 2022-03-16 MED ORDER — TIRZEPATIDE 10 MG/0.5ML ~~LOC~~ SOAJ: 10.0000 mg | SUBCUTANEOUS | 0 refills | Status: DC

## 2022-03-16 NOTE — Patient Instructions (Addendum)
We will check the urine protein.   We have sent in the next 3 months of the mounjaro to increase to 5 mg weekly, then 7.5 mg weekly for 1 month, then 10 mg weekly for 1 month.

## 2022-03-16 NOTE — Assessment & Plan Note (Signed)
Patient did not take BP meds today. Informed to take prior to visits in future. Home readings 120s/70s typically. Recheck closer to goal will keep hctz 25 mg daily and losartan 100 mg daily and amlodipine 10 mg daily prn.

## 2022-03-16 NOTE — Progress Notes (Signed)
   Subjective:   Patient ID: Lori Levy, female    DOB: January 30, 1966, 56 y.o.   MRN: [id number]  HPI The patient is a 56 YO female coming in for follow up starting mounjaro. Doing well.   Review of Systems  Constitutional: Negative.   HENT: Negative.    Eyes: Negative.   Respiratory:  Negative for cough, chest tightness and shortness of breath.   Cardiovascular:  Negative for chest pain, palpitations and leg swelling.  Gastrointestinal:  Negative for abdominal distention, abdominal pain, constipation, diarrhea, nausea and vomiting.  Musculoskeletal: Negative.   Skin: Negative.   Neurological: Negative.   Psychiatric/Behavioral: Negative.      Objective:  Physical Exam Constitutional:      Appearance: She is well-developed.  HENT:     Head: Normocephalic and atraumatic.  Cardiovascular:     Rate and Rhythm: Normal rate and regular rhythm.  Pulmonary:     Effort: Pulmonary effort is normal. No respiratory distress.     Breath sounds: Normal breath sounds. No wheezing or rales.  Abdominal:     General: Bowel sounds are normal. There is no distension.     Palpations: Abdomen is soft.     Tenderness: There is no abdominal tenderness. There is no rebound.  Musculoskeletal:     Cervical back: Normal range of motion.  Skin:    General: Skin is warm and dry.     Comments: Foot exam done  Neurological:     Mental Status: She is alert and oriented to person, place, and time.     Coordination: Coordination normal.     Vitals:   03/16/22 0938 03/16/22 0941 03/16/22 1002  BP: (!) 138/98 (!) 126/98 139/89  Pulse: 90    Temp: 98.5 F (36.9 C)    TempSrc: Oral    SpO2: 95%    Weight: 223 lb (101.2 kg)    Height: 5\' 5"  (1.651 m)      Assessment & Plan:

## 2022-03-16 NOTE — Assessment & Plan Note (Addendum)
We will titrate mounjaro to 5 mg weekly for 1 month then 7.5 mg weekly for 1 month then 10 mg weekly for 1 month then reassess. Foot exam done and microalbumin to creatinine ratio ordered today. Counseled about new diagnosis of diabetes based on >1 HgA1c above 6.5. Rx given for gym membership to help as well. Taking ARB and statin.

## 2022-03-16 NOTE — Assessment & Plan Note (Signed)
Weight is stable and did not have success with glp-1 wegovy. We have started mounjaro due to new diagnosis diabetes. She has started and no side effects with 2.5 mg weekly. Will titrate over next 3 months to 10 mg weekly and then reassess weight. Given rx for gym membership and encouraged activity 30 minutes 5 days a week.

## 2022-03-16 NOTE — Assessment & Plan Note (Signed)
Pravastatin 20 mg daily started with new diabetes diagnosis in January. She is tolerating this well. Will recheck lipid panel with next labs in 3 months. Goal LDL<100 (<70 ideal)

## 2022-04-08 ENCOUNTER — Other Ambulatory Visit: Payer: Self-pay | Admitting: Internal Medicine

## 2022-04-12 ENCOUNTER — Other Ambulatory Visit: Payer: Self-pay | Admitting: Internal Medicine

## 2022-04-13 MED ORDER — HYDROXYZINE HCL 50 MG PO TABS: 50.0000 mg | ORAL_TABLET | Freq: Three times a day (TID) | ORAL | 0 refills | Status: DC | PRN

## 2022-04-13 MED ORDER — VITAMIN B-12 1000 MCG PO TABS: 1000.0000 ug | ORAL_TABLET | Freq: Every day | ORAL | 3 refills | Status: AC

## 2022-04-19 ENCOUNTER — Encounter: Payer: Self-pay | Admitting: Internal Medicine

## 2022-04-19 NOTE — Telephone Encounter (Signed)
Spoke with  patient and was able to get her scheduled wit NP, Chip Boer

## 2022-04-25 ENCOUNTER — Telehealth (INDEPENDENT_AMBULATORY_CARE_PROVIDER_SITE_OTHER): Payer: 59 | Admitting: Family Medicine

## 2022-04-25 ENCOUNTER — Encounter: Payer: Self-pay | Admitting: Family Medicine

## 2022-04-25 DIAGNOSIS — Z8709 Personal history of other diseases of the respiratory system: Secondary | ICD-10-CM | POA: Diagnosis not present

## 2022-04-25 DIAGNOSIS — R509 Fever, unspecified: Secondary | ICD-10-CM | POA: Diagnosis not present

## 2022-04-25 DIAGNOSIS — J029 Acute pharyngitis, unspecified: Secondary | ICD-10-CM | POA: Diagnosis not present

## 2022-04-25 MED ORDER — FLUCONAZOLE 150 MG PO TABS: 150.0000 mg | ORAL_TABLET | Freq: Once | ORAL | 0 refills | Status: AC

## 2022-04-25 MED ORDER — AMOXICILLIN 875 MG PO TABS: 875.0000 mg | ORAL_TABLET | Freq: Two times a day (BID) | ORAL | 0 refills | Status: AC

## 2022-04-25 NOTE — Progress Notes (Signed)
MyChart Video Visit    Virtual Visit via Video Note   This visit type was conducted due to national recommendations for restrictions regarding the COVID-19 Pandemic (e.g. social distancing) in an effort to limit this patient's exposure and mitigate transmission in our community. This patient is at least at moderate risk for complications without adequate follow up. This format is felt to be most appropriate for this patient at this time. Physical exam was limited by quality of the video and audio technology used for the visit. CMA was able to get the patient set up on a video visit.  Patient location: Home. Patient and provider in visit Provider location: Office  I discussed the limitations of evaluation and management by telemedicine and the availability of in person appointments. The patient expressed understanding and agreed to proceed.  Visit Date: 04/25/2022  Today's healthcare provider: Harland Dingwall, NP-C     Subjective:    Patient ID: Lori Levy, female    DOB: [birth year], 56 y.o.   MRN: [id number]  Chief Complaint  Patient presents with   Sore Throat    Since thursday   Generalized Body Aches    Tested Thursday- negative covid    HPI  C/o a 6 day history of symptoms. Fever, body aches and sore throat. Sinus pressure as well. No longer having fever.  Negative flu and Covid.  Taking Tylenol.  Hot tea.   Hx of strep throat and it feels like when she has had it in the past.   Denies chills, dizziness, chest pain, palpitations, shortness of breath, abdominal pain, N/V/D, urinary symptoms, LE edema.     Past Medical History:  Diagnosis Date   Amenorrhea    Anemia    Hypertension    Menorrhagia    Migraine     Past Surgical History:  Procedure Laterality Date   ENDOMETRIAL ABLATION  2011   VAGINAL HYSTERECTOMY  2012    No family history on file.  Social History   Socioeconomic History   Marital status: Married    Spouse name: Not on file    Number of children: Not on file   Years of education: Not on file   Highest education level: Not on file  Occupational History   Not on file  Tobacco Use   Smoking status: Never   Smokeless tobacco: Never  Vaping Use   Vaping Use: Never used  Substance and Sexual Activity   Alcohol use: Yes    Comment: OCCASIONALLY/SR   Drug use: Not on file   Sexual activity: Yes    Birth control/protection: Surgical  Other Topics Concern   Not on file  Social History Narrative   Not on file   Social Determinants of Health   Financial Resource Strain: Not on file  Food Insecurity: Not on file  Transportation Needs: Not on file  Physical Activity: Not on file  Stress: Not on file  Social Connections: Not on file  Intimate Partner Violence: Not on file    Outpatient Medications Prior to Visit  Medication Sig Dispense Refill   amLODipine (NORVASC) 10 MG tablet TAKE 1 TABLET (10 MG TOTAL) BY MOUTH DAILY AS NEEDED (BP [id number] SYSTOLIC OR [id number] DIASTOLIC). 90 tablet 1   benzonatate (TESSALON) 200 MG capsule Take 1 capsule (200 mg total) by mouth 3 (three) times daily as needed. 60 capsule 3   cyanocobalamin (VITAMIN B12) 1000 MCG tablet Take 1 tablet (1,000 mcg total) by mouth daily. 90 tablet 3  escitalopram (LEXAPRO) 10 MG tablet Take 1 tablet (10 mg total) by mouth daily. 90 tablet 1   fluticasone (FLONASE) 50 MCG/ACT nasal spray SPRAY 2 SPRAYS INTO EACH NOSTRIL EVERY DAY 48 mL 2   hydrochlorothiazide (HYDRODIURIL) 25 MG tablet TAKE 1 TABLET (25 MG TOTAL) BY MOUTH DAILY. 90 tablet 3   hydrOXYzine (ATARAX) 50 MG tablet Take 1 tablet (50 mg total) by mouth 3 (three) times daily as needed. 30 tablet 0   losartan (COZAAR) 100 MG tablet Take 1 tablet (100 mg total) by mouth daily. 90 tablet 3   metFORMIN (GLUCOPHAGE-XR) 500 MG 24 hr tablet TAKE 1 TABLET BY MOUTH EVERY DAY WITH BREAKFAST 90 tablet 1   Multiple Vitamins-Minerals (MULTIVITAMIN WITH MINERALS) tablet Take 1 tablet by mouth daily.      pravastatin (PRAVACHOL) 20 MG tablet Take 1 tablet (20 mg total) by mouth daily. 90 tablet 3   Rimegepant Sulfate (NURTEC) 75 MG TBDP Take 1 tablet by mouth every other day as needed (migraine). 8 tablet 1   tirzepatide (MOUNJARO) 10 MG/0.5ML Pen Inject 10 mg into the skin once a week. 6 mL 0   tirzepatide (MOUNJARO) 5 MG/0.5ML Pen Inject 5 mg into the skin once a week. 6 mL 0   tirzepatide (MOUNJARO) 7.5 MG/0.5ML Pen Inject 7.5 mg into the skin once a week. 6 mL 0   tretinoin (RETIN-A) 0.1 % cream Apply topically at bedtime. 45 g 0   triamcinolone (KENALOG) 0.1 % Apply 1 application topically 2 (two) times daily. 100 g 2   Vitamin A (RETINOL MOLECULAR FILM) 0.3 % OIL Use topically on leg twice a day. 1 g 11   No facility-administered medications prior to visit.    Allergies  Allergen Reactions   Clarithromycin    Clarithromycin Nausea Only    Other reaction(s): Dizziness, Headache, Itching-Allergy Headaches Chills, headache     ROS     Objective:    Physical Exam  There were no vitals taken for this visit. Wt Readings from Last 3 Encounters:  03/16/22 223 lb (101.2 kg)  02/16/22 218 lb (98.9 kg)  06/27/21 223 lb 3.2 oz (101.2 kg)   Alert and oriented and in no acute distress.  Respirations unlabored.    Assessment & Plan:   Problem List Items Addressed This Visit   None Visit Diagnoses     Acute pharyngitis, unspecified etiology    -  Primary   Relevant Medications   amoxicillin (AMOXIL) 875 MG tablet   Fever, unspecified fever cause       History of strep pharyngitis       Relevant Medications   amoxicillin (AMOXIL) 875 MG tablet      Negative home COVID test per patient.  Symptoms feel like strep throat to her as in the past.  Amoxicillin prescribed.  Discussed symptomatic treatment.  Follow-up if not improving in the next 3 to 4 days or if not back to baseline when she completes the antibiotic.  I am having Richardo Priest. Halsted start on fluconazole and  amoxicillin. I am also having her maintain her multivitamin with minerals, Retinol Molecular Film, triamcinolone cream, tretinoin, benzonatate, hydrochlorothiazide, pravastatin, metFORMIN, losartan, escitalopram, amLODipine, Nurtec, fluticasone, tirzepatide, tirzepatide, tirzepatide, hydrOXYzine, and cyanocobalamin.  Meds ordered this encounter  Medications   fluconazole (DIFLUCAN) 150 MG tablet    Sig: Take 1 tablet (150 mg total) by mouth once for 1 dose.    Dispense:  1 tablet    Refill:  0  Order Specific Question:   Supervising Provider    Answer:   Pricilla Holm A [[id number]]   amoxicillin (AMOXIL) 875 MG tablet    Sig: Take 1 tablet (875 mg total) by mouth 2 (two) times daily for 10 days.    Dispense:  20 tablet    Refill:  0    Order Specific Question:   Supervising Provider    Answer:   Pricilla Holm A [id number]    I discussed the assessment and treatment plan with the patient. The patient was provided an opportunity to ask questions and all were answered. The patient agreed with the plan and demonstrated an understanding of the instructions.   The patient was advised to call back or seek an in-person evaluation if the symptoms worsen or if the condition fails to improve as anticipated.  I provided 15 minutes of face-to-face time during this encounter.   Harland Dingwall, NP-C [facility] [place] [phone] (phone) [phone] (fax)  [facility]

## 2022-05-07 ENCOUNTER — Other Ambulatory Visit: Payer: Self-pay | Admitting: Internal Medicine

## 2022-06-02 ENCOUNTER — Other Ambulatory Visit: Payer: Self-pay | Admitting: Internal Medicine

## 2022-06-05 ENCOUNTER — Other Ambulatory Visit: Payer: Self-pay | Admitting: Internal Medicine

## 2022-06-14 ENCOUNTER — Telehealth (INDEPENDENT_AMBULATORY_CARE_PROVIDER_SITE_OTHER): Payer: 59 | Admitting: Emergency Medicine

## 2022-06-14 ENCOUNTER — Encounter: Payer: Self-pay | Admitting: Emergency Medicine

## 2022-06-14 DIAGNOSIS — R0981 Nasal congestion: Secondary | ICD-10-CM

## 2022-06-14 DIAGNOSIS — J0100 Acute maxillary sinusitis, unspecified: Secondary | ICD-10-CM

## 2022-06-14 DIAGNOSIS — R051 Acute cough: Secondary | ICD-10-CM

## 2022-06-14 MED ORDER — BENZONATATE 200 MG PO CAPS: 200.0000 mg | ORAL_CAPSULE | Freq: Two times a day (BID) | ORAL | 0 refills | Status: DC | PRN

## 2022-06-14 MED ORDER — AMOXICILLIN-POT CLAVULANATE 875-125 MG PO TABS: 1.0000 | ORAL_TABLET | Freq: Two times a day (BID) | ORAL | 0 refills | Status: DC

## 2022-06-14 NOTE — Progress Notes (Signed)
Telemedicine Encounter- SOAP NOTE Established Patient MyChart video encounter Patient: Home  Provider: Office   Patient present only  This video encounter was conducted with the patient's (or proxy's) verbal consent via video telecommunications: yes/no: Yes Patient was instructed to have this encounter in a suitably private space; and to only have persons present to whom they give permission to participate. In addition, patient identity was confirmed by use of name plus two identifiers (DOB and address).  I discussed the limitations, risks, security and privacy concerns of performing an evaluation and management service by telephone and the availability of in person appointments. I also discussed with the patient that there may be a patient responsible charge related to this service. The patient expressed understanding and agreed to proceed.  I spent a total of TIME; 0 MIN TO 60 MIN: 25 minutes talking with the patient or their proxy.  Chief complaint: Sinus congestion and possible sinus infection  Subjective   Lori Levy is a 57 y.o. female established patient.  MyChart video visit today complaining of possible sinus infection.  Symptoms started about 3 days ago. Complaining of sinus congestion and nasal purulent discharge along with occasional fever. Tested negative for COVID and influenza. No other associated symptoms. Has history of similar infections in the past.  Antibiotics work for her. No other complaints or medical concerns today.  HPI   Patient Active Problem List   Diagnosis Date Noted   Hyperlipidemia associated with type 2 diabetes mellitus ([place]) 03/16/2022   Type 2 diabetes with complication ([place]) 63/87/5643   Intractable migraine with aura without status migrainosus 02/16/2022   Encounter for screening mammogram for malignant neoplasm of breast 02/16/2022   Chronic cough 02/02/2021   Morbid obesity ([place]) 07/13/2020   Hot flashes due to menopause 06/22/2020    Hypertension     Past Medical History:  Diagnosis Date   Amenorrhea    Anemia    Hypertension    Menorrhagia    Migraine     Current Outpatient Medications  Medication Sig Dispense Refill   amoxicillin-clavulanate (AUGMENTIN) 875-125 MG tablet Take 1 tablet by mouth 2 (two) times daily for 7 days. 14 tablet 0   benzonatate (TESSALON) 200 MG capsule Take 1 capsule (200 mg total) by mouth 2 (two) times daily as needed for cough. 20 capsule 0   amLODipine (NORVASC) 10 MG tablet TAKE 1 TABLET (10 MG TOTAL) BY MOUTH DAILY AS NEEDED (BP >329 SYSTOLIC OR >51 DIASTOLIC). 90 tablet 1   cyanocobalamin (VITAMIN B12) 1000 MCG tablet Take 1 tablet (1,000 mcg total) by mouth daily. 90 tablet 3   escitalopram (LEXAPRO) 10 MG tablet TAKE 1 TABLET BY MOUTH EVERY DAY 90 tablet 1   fluticasone (FLONASE) 50 MCG/ACT nasal spray SPRAY 2 SPRAYS INTO EACH NOSTRIL EVERY DAY 48 mL 2   hydrochlorothiazide (HYDRODIURIL) 25 MG tablet TAKE 1 TABLET (25 MG TOTAL) BY MOUTH DAILY. 90 tablet 3   hydrOXYzine (ATARAX) 50 MG tablet Take 1 tablet (50 mg total) by mouth 3 (three) times daily as needed. 30 tablet 0   losartan (COZAAR) 100 MG tablet Take 1 tablet (100 mg total) by mouth daily. 90 tablet 3   metFORMIN (GLUCOPHAGE-XR) 500 MG 24 hr tablet TAKE 1 TABLET BY MOUTH EVERY DAY WITH BREAKFAST 90 tablet 1   Multiple Vitamins-Minerals (MULTIVITAMIN WITH MINERALS) tablet Take 1 tablet by mouth daily.     pravastatin (PRAVACHOL) 20 MG tablet Take 1 tablet (20 mg total) by mouth daily.  90 tablet 3   Rimegepant Sulfate (NURTEC) 75 MG TBDP Take 1 tablet by mouth every other day as needed (migraine). 8 tablet 1   tirzepatide (MOUNJARO) 10 MG/0.5ML Pen Inject 10 mg into the skin once a week. 6 mL 0   tirzepatide (MOUNJARO) 5 MG/0.5ML Pen Inject 5 mg into the skin once a week. 6 mL 0   tirzepatide (MOUNJARO) 7.5 MG/0.5ML Pen Inject 7.5 mg into the skin once a week. 6 mL 0   tretinoin (RETIN-A) 0.1 % cream Apply topically at  bedtime. 45 g 0   triamcinolone (KENALOG) 0.1 % Apply 1 application topically 2 (two) times daily. 100 g 2   Vitamin A (RETINOL MOLECULAR FILM) 0.3 % OIL Use topically on leg twice a day. 1 g 11   No current facility-administered medications for this visit.    Allergies  Allergen Reactions   Clarithromycin    Clarithromycin Nausea Only    Other reaction(s): Dizziness, Headache, Itching-Allergy Headaches Chills, headache     Social History   Socioeconomic History   Marital status: Married    Spouse name: Not on file   Number of children: Not on file   Years of education: Not on file   Highest education level: Not on file  Occupational History   Not on file  Tobacco Use   Smoking status: Never   Smokeless tobacco: Never  Vaping Use   Vaping Use: Never used  Substance and Sexual Activity   Alcohol use: Yes    Comment: OCCASIONALLY/SR   Drug use: Not on file   Sexual activity: Yes    Birth control/protection: Surgical  Other Topics Concern   Not on file  Social History Narrative   Not on file   Social Determinants of Health   Financial Resource Strain: Not on file  Food Insecurity: Not on file  Transportation Needs: Not on file  Physical Activity: Not on file  Stress: Not on file  Social Connections: Not on file  Intimate Partner Violence: Not on file    Review of Systems  Constitutional:  Positive for fever.  HENT:  Positive for congestion. Negative for ear pain and sore throat.   Respiratory:  Positive for cough. Negative for hemoptysis and shortness of breath.   Cardiovascular: Negative.  Negative for chest pain and palpitations.  Gastrointestinal:  Negative for abdominal pain, diarrhea, nausea and vomiting.  Genitourinary: Negative.  Negative for dysuria and hematuria.  Skin: Negative.  Negative for rash.  Neurological: Negative.  Negative for dizziness and headaches.  All other systems reviewed and are negative.   Objective  Alert and oriented x 3  in no apparent respiratory distress Vitals as reported by the patient: There were no vitals filed for this visit.  Diagnoses and all orders for this visit:  Acute non-recurrent maxillary sinusitis -     amoxicillin-clavulanate (AUGMENTIN) 875-125 MG tablet; Take 1 tablet by mouth 2 (two) times daily for 7 days.  Sinus congestion  Acute cough -     benzonatate (TESSALON) 200 MG capsule; Take 1 capsule (200 mg total) by mouth 2 (two) times daily as needed for cough.  Clinically stable.  No red flag signs or symptoms.  Possible bacterial sinus infection Recommend to start Augmentin twice a day for 7 days Tessalon for cough.  Continue over-the-counter Mucinex dm Advised to rest and stay well-hydrated Recommend to use nasal saline sprays frequently during the day Advised to contact the office if no better or worse during the next  several days.   I discussed the assessment and treatment plan with the patient. The patient was provided an opportunity to ask questions and all were answered. The patient agreed with the plan and demonstrated an understanding of the instructions.   The patient was advised to call back or seek an in-person evaluation if the symptoms worsen or if the condition fails to improve as anticipated.  I provided 25 minutes of non-face-to-face time during this encounter.  Horald Pollen, MD  Primary Care at [facility]

## 2022-06-19 ENCOUNTER — Encounter: Payer: Self-pay | Admitting: Emergency Medicine

## 2022-06-19 ENCOUNTER — Other Ambulatory Visit: Payer: Self-pay | Admitting: Emergency Medicine

## 2022-06-19 DIAGNOSIS — R051 Acute cough: Secondary | ICD-10-CM

## 2022-06-19 MED ORDER — HYDROCODONE BIT-HOMATROP MBR 5-1.5 MG/5ML PO SOLN: 5.0000 mL | Freq: Every evening | ORAL | 0 refills | Status: DC | PRN

## 2022-06-19 NOTE — Telephone Encounter (Signed)
New prescription for Hycodan syrup sent to pharmacy of record.  This will help with the cough.  May want to stop Augmentin for a dose or 2.  Will need office visit if no better or worse.  Thanks.

## 2022-06-20 ENCOUNTER — Other Ambulatory Visit: Payer: Self-pay | Admitting: Emergency Medicine

## 2022-06-20 DIAGNOSIS — R051 Acute cough: Secondary | ICD-10-CM

## 2022-06-20 MED ORDER — GUAIFENESIN-CODEINE 100-10 MG/5ML PO SYRP: 5.0000 mL | ORAL_SOLUTION | Freq: Three times a day (TID) | ORAL | 1 refills | Status: DC | PRN

## 2022-06-20 NOTE — Progress Notes (Unsigned)
    Lori Levy T. Stassi Fadely, MD, [place] at [facility] [place], [address]  Phone: [phone]  FAX: [place] - 57 y.o. female  MRN [id number]  Date of Birth: Oct 09, 1965  Date: 06/21/2022  PCP: Lori Koch, MD  Referral: Lori Levy, *  No chief complaint on file.  Subjective:   Lori Levy is a 57 y.o. very pleasant female patient with There is no height or weight on file to calculate BMI. who presents with the following:  This is a very pleasant patient of Dr. Nathanial Levy, and she presents today for an acute visit for some ongoing nasal congestion and a question of potential sinusitis.    Review of Systems is noted in the HPI, as appropriate  Objective:   There were no vitals taken for this visit.  GEN: No acute distress; alert,appropriate. PULM: Breathing comfortably in no respiratory distress PSYCH: Normally interactive.   Laboratory and Imaging Data:  Assessment and Plan:   ***

## 2022-06-20 NOTE — Telephone Encounter (Signed)
New prescription sent to pharmacy of record today.  Thanks.

## 2022-06-21 ENCOUNTER — Ambulatory Visit: Payer: 59 | Admitting: Internal Medicine

## 2022-06-21 ENCOUNTER — Ambulatory Visit (INDEPENDENT_AMBULATORY_CARE_PROVIDER_SITE_OTHER): Payer: 59 | Admitting: Family Medicine

## 2022-06-21 ENCOUNTER — Encounter: Payer: Self-pay | Admitting: Family Medicine

## 2022-06-21 VITALS — BP 110/84 | HR 108 | Temp 98.9°F | Ht 65.0 in | Wt 217.5 lb

## 2022-06-21 DIAGNOSIS — J0100 Acute maxillary sinusitis, unspecified: Secondary | ICD-10-CM

## 2022-06-21 DIAGNOSIS — R051 Acute cough: Secondary | ICD-10-CM | POA: Diagnosis not present

## 2022-06-21 MED ORDER — FLUCONAZOLE 150 MG PO TABS: ORAL_TABLET | ORAL | 0 refills | Status: DC

## 2022-06-21 MED ORDER — DOXYCYCLINE HYCLATE 100 MG PO TABS: 100.0000 mg | ORAL_TABLET | Freq: Two times a day (BID) | ORAL | 0 refills | Status: DC

## 2022-06-27 ENCOUNTER — Ambulatory Visit: Payer: 59 | Admitting: Internal Medicine

## 2022-06-29 ENCOUNTER — Other Ambulatory Visit: Payer: Self-pay | Admitting: Internal Medicine

## 2022-07-20 ENCOUNTER — Inpatient Hospital Stay: Admission: RE | Admit: 2022-07-20 | Payer: 59 | Source: Ambulatory Visit

## 2022-07-25 ENCOUNTER — Other Ambulatory Visit: Payer: Self-pay | Admitting: Internal Medicine

## 2022-07-25 DIAGNOSIS — R051 Acute cough: Secondary | ICD-10-CM

## 2022-07-27 ENCOUNTER — Ambulatory Visit
Admission: RE | Admit: 2022-07-27 | Discharge: 2022-07-27 | Disposition: A | Discharge status: Home / Self Care | Payer: 59 | Source: Ambulatory Visit | Attending: Nurse Practitioner | Admitting: Nurse Practitioner

## 2022-07-27 DIAGNOSIS — Z1231 Encounter for screening mammogram for malignant neoplasm of breast: Secondary | ICD-10-CM

## 2022-07-28 ENCOUNTER — Other Ambulatory Visit: Payer: Self-pay | Admitting: Internal Medicine

## 2022-07-28 ENCOUNTER — Telehealth (INDEPENDENT_AMBULATORY_CARE_PROVIDER_SITE_OTHER): Payer: 59 | Admitting: Internal Medicine

## 2022-07-28 ENCOUNTER — Encounter: Payer: Self-pay | Admitting: Internal Medicine

## 2022-07-28 DIAGNOSIS — I10 Essential (primary) hypertension: Secondary | ICD-10-CM

## 2022-07-28 DIAGNOSIS — N951 Menopausal and female climacteric states: Secondary | ICD-10-CM | POA: Diagnosis not present

## 2022-07-28 DIAGNOSIS — R051 Acute cough: Secondary | ICD-10-CM | POA: Diagnosis not present

## 2022-07-28 DIAGNOSIS — E118 Type 2 diabetes mellitus with unspecified complications: Secondary | ICD-10-CM

## 2022-07-28 DIAGNOSIS — R053 Chronic cough: Secondary | ICD-10-CM

## 2022-07-28 MED ORDER — TIRZEPATIDE 12.5 MG/0.5ML ~~LOC~~ SOAJ: 12.5000 mg | SUBCUTANEOUS | 0 refills | Status: DC

## 2022-07-28 MED ORDER — ESCITALOPRAM OXALATE 10 MG PO TABS: 10.0000 mg | ORAL_TABLET | Freq: Every day | ORAL | 3 refills | Status: DC

## 2022-07-28 MED ORDER — BENZONATATE 200 MG PO CAPS: 200.0000 mg | ORAL_CAPSULE | Freq: Two times a day (BID) | ORAL | 3 refills | Status: DC | PRN

## 2022-07-28 MED ORDER — HYDROCHLOROTHIAZIDE 25 MG PO TABS: 25.0000 mg | ORAL_TABLET | Freq: Every day | ORAL | 3 refills | Status: DC

## 2022-07-28 MED ORDER — AMLODIPINE BESYLATE 10 MG PO TABS: 10.0000 mg | ORAL_TABLET | Freq: Every day | ORAL | 3 refills | Status: DC | PRN

## 2022-07-28 MED ORDER — LOSARTAN POTASSIUM 100 MG PO TABS: 100.0000 mg | ORAL_TABLET | Freq: Every day | ORAL | 3 refills | Status: DC

## 2022-07-28 MED ORDER — TIRZEPATIDE 15 MG/0.5ML ~~LOC~~ SOAJ: 15.0000 mg | SUBCUTANEOUS | 1 refills | Status: DC

## 2022-07-28 NOTE — Assessment & Plan Note (Signed)
Refill lexapro 10 mg daily and is out currently and worsening hot flashes.

## 2022-07-28 NOTE — Assessment & Plan Note (Signed)
Increase mounjaro to 12.5 mg weekly once out of 10 mg in 2 weeks. Then 1 month later increase to 15 mg weekly and continue that dosing. Follow up 3-6 months for HgA1c and weight.

## 2022-07-28 NOTE — Progress Notes (Signed)
Virtual Visit via Video Note  I connected with Lori Levy on 07/28/22 at  2:40 PM EST by a video enabled telemedicine application and verified that I am speaking with the correct person using two identifiers.  The patient and the provider were at separate locations throughout the entire encounter. Patient location: home, Provider location: work   I discussed the limitations of evaluation and management by telemedicine and the availability of in person appointments. The patient expressed understanding and agreed to proceed. The patient and the provider were the only parties present for the visit unless noted in HPI below.  History of Present Illness: The patient is a 57 y.o. female with visit for follow up diabetes/weight started mounjaro and on 10 mg weekly for 2 weeks. Down 8 pounds.   Observations/Objective: Appearance: normal, breathing appears normal, casual grooming, abdomen does not appear distended  Assessment and Plan: See problem oriented charting  Follow Up Instructions: rx mounjaro 12.5 mg weekly for 1 month then 15 mg weekly ongoing. Follow up 3-6 months for labs and weigh in.  I discussed the assessment and treatment plan with the patient. The patient was provided an opportunity to ask questions and all were answered. The patient agreed with the plan and demonstrated an understanding of the instructions.   The patient was advised to call back or seek an in-person evaluation if the symptoms worsen or if the condition fails to improve as anticipated.  Hoyt Koch, MD

## 2022-07-28 NOTE — Assessment & Plan Note (Signed)
BP normal at home. Needs refills of all medications. Continue amlodipine 10 mg daily prn and hctz 25 mg daily and losartan 100 mg daily and continue to monitor closely in the setting of weight loss.

## 2022-07-28 NOTE — Assessment & Plan Note (Signed)
Weight down about 8 pounds on mounjaro and will continue titration. Increase to 12.5 mg weekly next month and then 1 month later increase to 15 mg weekly. Is down about 4-5% and advised that weight loss results are typically after 3 months of full dose.

## 2022-07-28 NOTE — Assessment & Plan Note (Signed)
Getting over URI end of January and still coughing. Refill tessalon perles today can use BID prn.

## 2022-09-04 ENCOUNTER — Telehealth: Payer: Self-pay | Admitting: Internal Medicine

## 2022-09-04 NOTE — Telephone Encounter (Signed)
Patient called and said her pharmacy only refilled her hydrOXYzine (ATARAX) 50 MG tablet  with 6 pills. She would like to know what the issue is. She would like a call back at [phone].

## 2022-09-05 MED ORDER — HYDROXYZINE HCL 50 MG PO TABS: 50.0000 mg | ORAL_TABLET | Freq: Three times a day (TID) | ORAL | 1 refills | Status: DC | PRN

## 2022-09-05 NOTE — Telephone Encounter (Signed)
Sent in refill

## 2022-09-19 ENCOUNTER — Other Ambulatory Visit: Payer: Self-pay

## 2022-09-19 ENCOUNTER — Other Ambulatory Visit: Payer: Self-pay | Admitting: Internal Medicine

## 2022-09-19 ENCOUNTER — Telehealth: Payer: Self-pay | Admitting: Internal Medicine

## 2022-09-19 MED ORDER — TIRZEPATIDE 15 MG/0.5ML ~~LOC~~ SOAJ: 15.0000 mg | SUBCUTANEOUS | 1 refills | Status: DC

## 2022-09-19 NOTE — Telephone Encounter (Signed)
Prescription Request  09/19/2022  LOV: 03/16/2022(VV 07/28/2022)  What is the name of the medication or equipment?  tirzepatide [facility]) 15 MG/0.5ML Pen   Have you contacted your pharmacy to request a refill? Yes   Which pharmacy would you like this sent to?  [name],  - [name] [place] [address] Phone: [phone] Fax: [phone]    Patient notified that their request is being sent to the clinical staff for review and that they should receive a response within 2 business days.   Please advise at Mobile [phone] (mobile)    Patient would like a call back from [place]'s nurse.

## 2022-10-11 ENCOUNTER — Other Ambulatory Visit: Payer: Self-pay | Admitting: Internal Medicine

## 2022-10-11 ENCOUNTER — Telehealth: Payer: Self-pay | Admitting: Internal Medicine

## 2022-10-11 ENCOUNTER — Other Ambulatory Visit: Payer: Self-pay

## 2022-10-11 MED ORDER — TIRZEPATIDE 15 MG/0.5ML ~~LOC~~ SOAJ: 15.0000 mg | SUBCUTANEOUS | 1 refills | Status: DC

## 2022-10-11 NOTE — Telephone Encounter (Signed)
Prescription Request  10/11/2022  LOV: 03/16/2022  What is the name of the medication or equipment? tirzepatide [facility]) 15 MG/0.5ML Pen   Have you contacted your pharmacy to request a refill? Yes   Which pharmacy would you like this sent to?  [name], [place] - [name] [place] [address] Phone: [phone] Fax: [phone]    Patient notified that their request is being sent to the clinical staff for review and that they should receive a response within 2 business days.   Please advise at Mobile [phone] (mobile)    Patient called and said she thinks her pharmacy sent in all doses of Mounjaro when she only needs the 15 mg.

## 2022-11-06 ENCOUNTER — Other Ambulatory Visit: Payer: Self-pay | Admitting: Internal Medicine

## 2022-12-06 ENCOUNTER — Other Ambulatory Visit: Payer: Self-pay | Admitting: Internal Medicine

## 2022-12-06 DIAGNOSIS — R051 Acute cough: Secondary | ICD-10-CM

## 2022-12-08 ENCOUNTER — Other Ambulatory Visit: Payer: Self-pay | Admitting: Internal Medicine

## 2022-12-14 ENCOUNTER — Other Ambulatory Visit: Payer: Self-pay | Admitting: Internal Medicine

## 2022-12-23 ENCOUNTER — Other Ambulatory Visit: Payer: Self-pay | Admitting: Internal Medicine

## 2022-12-27 MED ORDER — MOUNJARO 7.5 MG/0.5ML ~~LOC~~ SOAJ: 7.5000 mg | SUBCUTANEOUS | 0 refills | Status: DC

## 2022-12-27 NOTE — Addendum Note (Signed)
Addended by: Deatra James on: 12/27/2022 11:43 AM   Modules accepted: Orders

## 2023-01-07 ENCOUNTER — Other Ambulatory Visit: Payer: Self-pay | Admitting: Internal Medicine

## 2023-02-11 ENCOUNTER — Other Ambulatory Visit: Payer: Self-pay | Admitting: Internal Medicine

## 2023-02-12 NOTE — Telephone Encounter (Signed)
Pt was previously given #30 and asked to make a SEPT appt and did not = this refill for pravastatin was declined.

## 2023-02-20 ENCOUNTER — Telehealth: Payer: 59 | Admitting: Physician Assistant

## 2023-02-20 DIAGNOSIS — J029 Acute pharyngitis, unspecified: Secondary | ICD-10-CM

## 2023-02-20 MED ORDER — PREDNISONE 20 MG PO TABS: 40.0000 mg | ORAL_TABLET | Freq: Every day | ORAL | 0 refills | Status: DC

## 2023-02-20 MED ORDER — AMOXICILLIN 500 MG PO CAPS: 500.0000 mg | ORAL_CAPSULE | Freq: Three times a day (TID) | ORAL | 0 refills | Status: DC

## 2023-02-20 MED ORDER — FLUCONAZOLE 150 MG PO TABS: ORAL_TABLET | ORAL | 0 refills | Status: DC

## 2023-02-20 MED ORDER — FLUTICASONE PROPIONATE 50 MCG/ACT NA SUSP: 2.0000 | Freq: Every day | NASAL | 2 refills | Status: AC

## 2023-02-20 NOTE — Patient Instructions (Addendum)
Enid Derry, thank you for joining Lori Climes, PA-C for today's virtual visit.  While this provider is not your primary care provider (PCP), if your PCP is located in our provider database this encounter information will be shared with them immediately following your visit.   A [facility] MyChart account gives you access to today's visit and all your visits, tests, and labs performed at [facility] " click here if you don't have a [facility] MyChart account or go to [email or web address]  Consent: (Patient) KAYLEANNA BOOTHBY provided verbal consent for this virtual visit at the beginning of the encounter.  Current Medications:  Current Outpatient Medications:    amLODipine (NORVASC) 10 MG tablet, Take 1 tablet (10 mg total) by mouth daily as needed (BP >150 systolic or >95 diastolic)., Disp: 90 tablet, Rfl: 3   benzonatate (TESSALON) 200 MG capsule, Take 1 capsule (200 mg total) by mouth 2 (two) times daily as needed for cough., Disp: 60 capsule, Rfl: 3   cyanocobalamin (VITAMIN B12) 1000 MCG tablet, Take 1 tablet (1,000 mcg total) by mouth daily., Disp: 90 tablet, Rfl: 3   escitalopram (LEXAPRO) 10 MG tablet, Take 1 tablet (10 mg total) by mouth daily., Disp: 90 tablet, Rfl: 3   fluticasone (FLONASE) 50 MCG/ACT nasal spray, SPRAY 2 SPRAYS INTO EACH NOSTRIL EVERY DAY, Disp: 48 mL, Rfl: 1   hydrochlorothiazide (HYDRODIURIL) 25 MG tablet, Take 1 tablet (25 mg total) by mouth daily., Disp: 90 tablet, Rfl: 3   hydrOXYzine (ATARAX) 50 MG tablet, TAKE 1 TABLET BY MOUTH 3 TIMES DAILY AS NEEDED., Disp: 30 tablet, Rfl: 1   losartan (COZAAR) 100 MG tablet, Take 1 tablet (100 mg total) by mouth daily., Disp: 90 tablet, Rfl: 3   metFORMIN (GLUCOPHAGE-XR) 500 MG 24 hr tablet, TAKE 1 TABLET BY MOUTH EVERY DAY WITH BREAKFAST, Disp: 90 tablet, Rfl: 1   Multiple Vitamins-Minerals (MULTIVITAMIN WITH MINERALS) tablet, Take 1 tablet by mouth daily., Disp: , Rfl:    pravastatin  (PRAVACHOL) 20 MG tablet, Take 1 tablet (20 mg total) by mouth daily. Follow-up appt due in Sept must see provider for future refills, Disp: 30 tablet, Rfl: 0   Rimegepant Sulfate (NURTEC) 75 MG TBDP, Take 1 tablet by mouth every other day as needed (migraine)., Disp: 8 tablet, Rfl: 1   tirzepatide (MOUNJARO) 10 MG/0.5ML Pen, INJECT 10 MG INTO THE SKIN ONE TIME PER WEEK, Disp: 6 mL, Rfl: 0   tirzepatide (MOUNJARO) 12.5 MG/0.5ML Pen, INJECT 12.5 MG SUBCUTANEOUSLY ONE TIME PER WEEK, Disp: 2 mL, Rfl: 3   tirzepatide (MOUNJARO) 15 MG/0.5ML Pen, Inject 15 mg into the skin once a week., Disp: 6 mL, Rfl: 1   tirzepatide (MOUNJARO) 7.5 MG/0.5ML Pen, Inject 7.5 mg into the skin once a week., Disp: 2 mL, Rfl: 0   tretinoin (RETIN-A) 0.1 % cream, Apply topically at bedtime., Disp: 45 g, Rfl: 0   triamcinolone (KENALOG) 0.1 %, Apply 1 application topically 2 (two) times daily., Disp: 100 g, Rfl: 2   Vitamin A (RETINOL MOLECULAR FILM) 0.3 % OIL, Use topically on leg twice a day., Disp: 1 g, Rfl: 11   Medications ordered in this encounter:  No orders of the defined types were placed in this encounter.    *If you need refills on other medications prior to your next appointment, please contact your pharmacy*  Follow-Up: Call back or seek an in-person evaluation if the symptoms worsen or if the condition fails to improve as anticipated.  [facility] [phone]  Other Instructions Strep Throat, Adult Strep throat is an infection in the throat that is caused by bacteria. It is common during the cold months of the year. It mostly affects children who are 76-29 years old. However, people of all ages can get it at any time of the year. This infection spreads from person to person (is contagious) through coughing, sneezing, or having close contact. Your health care provider may use other names to describe the infection. When strep throat affects the tonsils, it is called tonsillitis. When it affects  the back of the throat, it is called pharyngitis. What are the causes? This condition is caused by the Streptococcus pyogenes bacteria. What increases the risk? You are more likely to develop this condition if: You care for school-age children, or are around school-age children. Children are more likely to get strep throat and may spread it to others. You spend time in crowded places where the infection can spread easily. You have close contact with someone who has strep throat. What are the signs or symptoms? Symptoms of this condition include: Fever or chills. Redness, swelling, or pain in the tonsils or throat. Pain or difficulty when swallowing. White or yellow spots on the tonsils or throat. Tender glands in the neck and under the jaw. Bad smelling breath. Red rash all over the body. This is rare. How is this diagnosed? This condition is diagnosed by tests that check for the presence and the amount of bacteria that cause strep throat. They are: Rapid strep test. Your throat is swabbed and checked for the presence of bacteria. Results are usually ready in minutes. Throat culture test. Your throat is swabbed. The sample is placed in a cup that allows infections to grow. Results are usually ready in 1 or 2 days. How is this treated? This condition may be treated with: Medicines that kill germs (antibiotics). Medicines that relieve pain or fever. These include: Ibuprofen or acetaminophen. Aspirin, only for people who are over the age of 52. Throat lozenges. Throat sprays. Follow these instructions at home: Medicines  Take over-the-counter and prescription medicines only as told by your health care provider. Take your antibiotic medicine as told by your health care provider. Do not stop taking the antibiotic even if you start to feel better. Eating and drinking  If you have trouble swallowing, try eating soft foods until your sore throat feels better. Drink enough fluid to keep  your urine pale yellow. To help relieve pain, you may have: Warm fluids, such as soup and tea. Cold fluids, such as frozen desserts or popsicles. General instructions Gargle with a salt-water mixture 3-4 times a day or as needed. To make a salt-water mixture, completely dissolve -1 tsp (3-6 g) of salt in 1 cup (237 mL) of warm water. Get plenty of rest. Stay home from work or school until you have been taking antibiotics for 24 hours. Do not use any products that contain nicotine or tobacco. These products include cigarettes, chewing tobacco, and vaping devices, such as e-cigarettes. If you need help quitting, ask your health care provider. It is up to you to get your test results. Ask your health care provider, or the department that is doing the test, when your results will be ready. Keep all follow-up visits. This is important. How is this prevented?  Do not share food, drinking cups, or personal items that could cause the infection to spread to other people. Wash your hands often with soap  and water for at least 20 seconds. If soap and water are not available, use hand sanitizer. Make sure that all people in your house wash their hands well. Have family members tested if they have a sore throat or fever. They may need an antibiotic if they have strep throat. Contact a health care provider if: You have swelling in your neck that keeps getting bigger. You develop a rash, cough, or earache. You cough up a thick mucus that is green, yellow-brown, or bloody. You have pain or discomfort that does not get better with medicine. Your symptoms seem to be getting worse. You have a fever. Get help right away if: You have new symptoms, such as vomiting, severe headache, stiff or painful neck, chest pain, or shortness of breath. You have severe throat pain, drooling, or changes in your voice. You have swelling of the neck, or the skin on the neck becomes red and tender. You have signs of  dehydration, such as tiredness (fatigue), dry mouth, and decreased urination. You become increasingly sleepy, or you cannot wake up completely. Your joints become red or painful. These symptoms may represent a serious problem that is an emergency. Do not wait to see if the symptoms will go away. Get medical help right away. Call your local emergency services (911 in the [place] Do not drive yourself to the hospital. Summary Strep throat is an infection in the throat that is caused by the Streptococcus pyogenes bacteria. This infection is spread from person to person (is contagious) through coughing, sneezing, or having close contact. Take your medicines, including antibiotics, as told by your health care provider. Do not stop taking the antibiotic even if you start to feel better. To prevent the spread of germs, wash your hands well with soap and water. Have others do the same. Do not share food, drinking cups, or personal items. Get help right away if you have new symptoms, such as vomiting, severe headache, stiff or painful neck, chest pain, or shortness of breath. This information is not intended to replace advice given to you by your health care provider. Make sure you discuss any questions you have with your health care provider. Document Revised: 09/07/2020 Document Reviewed: 09/07/2020 Elsevier Patient Education  2024 Elsevier Inc.    If you have been instructed to have an in-person evaluation today at a local Urgent Care facility, please use the link below. It will take you to a list of all of our available [facility], including address, phone number and hours of operation. Please do not delay care.  [facility]  If you or a family member do not have a primary care provider, use the link below to schedule a visit and establish care. When you choose a [facility] primary care physician or advanced practice provider, you gain a long-term partner in health. Find a  Primary Care Provider  Learn more about [facility] in-office and virtual care options: [facility] - Get Care Now

## 2023-02-20 NOTE — Progress Notes (Signed)
Virtual Visit Consent   Lori Levy, you are scheduled for a virtual visit with a [facility] provider today. Just as with appointments in the office, your consent must be obtained to participate. Your consent will be active for this visit and any virtual visit you may have with one of our providers in the next 365 days. If you have a MyChart account, a copy of this consent can be sent to you electronically.  As this is a virtual visit, video technology does not allow for your provider to perform a traditional examination. This may limit your provider's ability to fully assess your condition. If your provider identifies any concerns that need to be evaluated in person or the need to arrange testing (such as labs, EKG, etc.), we will make arrangements to do so. Although advances in technology are sophisticated, we cannot ensure that it will always work on either your end or our end. If the connection with a video visit is poor, the visit may have to be switched to a telephone visit. With either a video or telephone visit, we are not always able to ensure that we have a secure connection.  By engaging in this virtual visit, you consent to the provision of healthcare and authorize for your insurance to be billed (if applicable) for the services provided during this visit. Depending on your insurance coverage, you may receive a charge related to this service.  I need to obtain your verbal consent now. Are you willing to proceed with your visit today? [name] has provided verbal consent on 02/20/2023 for a virtual visit (video or telephone). Lori Levy, [place]  Date: 02/20/2023 6:07 PM  Virtual Visit via Video Note   I, Lori Levy, connected with  KATTIE PINK  ([id number], [birth year]) on 02/20/23 at  6:00 PM EDT by a video-enabled telemedicine application and verified that I am speaking with the correct person using two identifiers.  Location: Patient: Virtual Visit Location  Patient: Home Provider: Virtual Visit Location Provider: Home Office   I discussed the limitations of evaluation and management by telemedicine and the availability of in person appointments. The patient expressed understanding and agreed to proceed.    History of Present Illness: Lori Levy is a 57 y.o. who identifies as a female who was assigned female at birth, and is being seen today for sore throat x 2 days of headache and severe sore throat. Denies cough or substantial congestion.  Unsure of fever, possibly last night. Was tested for COVID x 2 to be cautious and unremarkable. Noting white patches on her tonsil. Substantial history of strep -- usually 2 x year in an adult.   HPI: HPI  Problems:  Patient Active Problem List   Diagnosis Date Noted   Hyperlipidemia associated with type 2 diabetes mellitus (HCC) 03/16/2022   Type 2 diabetes with complication (HCC) 02/16/2022   Intractable migraine with aura without status migrainosus 02/16/2022   Encounter for screening mammogram for malignant neoplasm of breast 02/16/2022   Chronic cough 02/02/2021   Morbid obesity (HCC) 07/13/2020   Hot flashes due to menopause 06/22/2020   Hypertension     Allergies:  Allergies  Allergen Reactions   Clarithromycin    Clarithromycin Nausea Only    Other reaction(s): Dizziness, Headache, Itching-Allergy Headaches Chills, headache    Medications:  Current Outpatient Medications:    amoxicillin (AMOXIL) 500 MG capsule, Take 1 capsule (500 mg total) by mouth 3 (three) times daily for 10  days., Disp: 30 capsule, Rfl: 0   fluconazole (DIFLUCAN) 150 MG tablet, Take 1 tablet PO once. Repeat in 3 days if needed., Disp: 2 tablet, Rfl: 0   predniSONE (DELTASONE) 20 MG tablet, Take 2 tablets (40 mg total) by mouth daily with breakfast., Disp: 6 tablet, Rfl: 0   amLODipine (NORVASC) 10 MG tablet, Take 1 tablet (10 mg total) by mouth daily as needed (BP >150 systolic or >95 diastolic)., Disp: 90 tablet,  Rfl: 3   cyanocobalamin (VITAMIN B12) 1000 MCG tablet, Take 1 tablet (1,000 mcg total) by mouth daily., Disp: 90 tablet, Rfl: 3   escitalopram (LEXAPRO) 10 MG tablet, Take 1 tablet (10 mg total) by mouth daily., Disp: 90 tablet, Rfl: 3   fluticasone (FLONASE) 50 MCG/ACT nasal spray, Place 2 sprays into both nostrils daily., Disp: 48 mL, Rfl: 2   hydrochlorothiazide (HYDRODIURIL) 25 MG tablet, Take 1 tablet (25 mg total) by mouth daily., Disp: 90 tablet, Rfl: 3   hydrOXYzine (ATARAX) 50 MG tablet, TAKE 1 TABLET BY MOUTH 3 TIMES DAILY AS NEEDED., Disp: 30 tablet, Rfl: 1   losartan (COZAAR) 100 MG tablet, Take 1 tablet (100 mg total) by mouth daily., Disp: 90 tablet, Rfl: 3   metFORMIN (GLUCOPHAGE-XR) 500 MG 24 hr tablet, TAKE 1 TABLET BY MOUTH EVERY DAY WITH BREAKFAST, Disp: 90 tablet, Rfl: 1   Multiple Vitamins-Minerals (MULTIVITAMIN WITH MINERALS) tablet, Take 1 tablet by mouth daily., Disp: , Rfl:    pravastatin (PRAVACHOL) 20 MG tablet, Take 1 tablet (20 mg total) by mouth daily. Follow-up appt due in Sept must see provider for future refills, Disp: 30 tablet, Rfl: 0   Rimegepant Sulfate (NURTEC) 75 MG TBDP, Take 1 tablet by mouth every other day as needed (migraine)., Disp: 8 tablet, Rfl: 1   tirzepatide (MOUNJARO) 10 MG/0.5ML Pen, INJECT 10 MG INTO THE SKIN ONE TIME PER WEEK, Disp: 6 mL, Rfl: 0   tirzepatide (MOUNJARO) 12.5 MG/0.5ML Pen, INJECT 12.5 MG SUBCUTANEOUSLY ONE TIME PER WEEK, Disp: 2 mL, Rfl: 3   tirzepatide (MOUNJARO) 15 MG/0.5ML Pen, Inject 15 mg into the skin once a week., Disp: 6 mL, Rfl: 1   tirzepatide (MOUNJARO) 7.5 MG/0.5ML Pen, Inject 7.5 mg into the skin once a week., Disp: 2 mL, Rfl: 0   tretinoin (RETIN-A) 0.1 % cream, Apply topically at bedtime., Disp: 45 g, Rfl: 0   triamcinolone (KENALOG) 0.1 %, Apply 1 application topically 2 (two) times daily., Disp: 100 g, Rfl: 2   Vitamin A (RETINOL MOLECULAR FILM) 0.3 % OIL, Use topically on leg twice a day., Disp: 1 g, Rfl:  11  Observations/Objective: Patient is well-developed, well-nourished in no acute distress.  Resting comfortably at home.  Head is normocephalic, atraumatic.  No labored breathing. Speech is clear and coherent with logical content.  Patient is alert and oriented at baseline.   Assessment and Plan: 1. Exudative pharyngitis - amoxicillin (AMOXIL) 500 MG capsule; Take 1 capsule (500 mg total) by mouth 3 (three) times daily for 10 days.  Dispense: 30 capsule; Refill: 0 - predniSONE (DELTASONE) 20 MG tablet; Take 2 tablets (40 mg total) by mouth daily with breakfast.  Dispense: 6 tablet; Refill: 0  Rx Amoxicillin.  Increase fluids.  Rest.  Saline nasal spray.  Probiotic.  Mucinex as directed.  Humidifier in bedroom. Prednisone per orders.  Call or return to clinic if symptoms are not improving.   Follow Up Instructions: I discussed the assessment and treatment plan with the patient. The patient was  provided an opportunity to ask questions and all were answered. The patient agreed with the plan and demonstrated an understanding of the instructions.  A copy of instructions were sent to the patient via MyChart unless otherwise noted below.   The patient was advised to call back or seek an in-person evaluation if the symptoms worsen or if the condition fails to improve as anticipated.  Time:  I spent 10 minutes with the patient via telehealth technology discussing the above problems/concerns.    Lori Climes, PA-C

## 2023-02-27 ENCOUNTER — Other Ambulatory Visit: Payer: Self-pay | Admitting: Internal Medicine

## 2023-02-27 ENCOUNTER — Telehealth (INDEPENDENT_AMBULATORY_CARE_PROVIDER_SITE_OTHER): Payer: 59 | Admitting: Internal Medicine

## 2023-02-27 ENCOUNTER — Encounter: Payer: Self-pay | Admitting: Internal Medicine

## 2023-02-27 DIAGNOSIS — L659 Nonscarring hair loss, unspecified: Secondary | ICD-10-CM | POA: Diagnosis not present

## 2023-02-27 DIAGNOSIS — J029 Acute pharyngitis, unspecified: Secondary | ICD-10-CM | POA: Insufficient documentation

## 2023-02-27 DIAGNOSIS — Z7985 Long-term (current) use of injectable non-insulin antidiabetic drugs: Secondary | ICD-10-CM | POA: Diagnosis not present

## 2023-02-27 DIAGNOSIS — E118 Type 2 diabetes mellitus with unspecified complications: Secondary | ICD-10-CM | POA: Diagnosis not present

## 2023-02-27 DIAGNOSIS — E785 Hyperlipidemia, unspecified: Secondary | ICD-10-CM

## 2023-02-27 DIAGNOSIS — E1169 Type 2 diabetes mellitus with other specified complication: Secondary | ICD-10-CM

## 2023-02-27 MED ORDER — CEFDINIR 300 MG PO CAPS: 300.0000 mg | ORAL_CAPSULE | Freq: Two times a day (BID) | ORAL | 0 refills | Status: DC

## 2023-02-27 MED ORDER — ESCITALOPRAM OXALATE 10 MG PO TABS: 10.0000 mg | ORAL_TABLET | Freq: Every day | ORAL | 3 refills | Status: DC

## 2023-02-27 MED ORDER — PROMETHAZINE-DM 6.25-15 MG/5ML PO SYRP: 5.0000 mL | ORAL_SOLUTION | Freq: Four times a day (QID) | ORAL | 0 refills | Status: DC | PRN

## 2023-02-27 NOTE — Assessment & Plan Note (Signed)
Checking lipid panel as due and adjust pravastatin 20 mg daily as needed.

## 2023-02-27 NOTE — Assessment & Plan Note (Signed)
Suspected resistant strep but without swab she will need in person visit if not responding to assess more adequately. Rx omnicef 300 mg BID for 10 days.

## 2023-02-27 NOTE — Assessment & Plan Note (Signed)
Checking TSH and B12 and vitamin D to assess metabolic cause. Could be related to mounjaro or weight loss.

## 2023-02-27 NOTE — Assessment & Plan Note (Signed)
Taking mounjaro now and needs HGA1c and lipid panel and microalbumin to creatinine ratio for follo wup. On ARB and statin.

## 2023-02-27 NOTE — Progress Notes (Signed)
Virtual Visit via Video Note  I connected with Lori Levy on 02/27/23 at  3:00 PM EDT by a video enabled telemedicine application and verified that I am speaking with the correct person using two identifiers.  The patient and the provider were at separate locations throughout the entire encounter. Patient location: home, Provider location: work   I discussed the limitations of evaluation and management by telemedicine and the availability of in person appointments. The patient expressed understanding and agreed to proceed. The patient and the provider were the only parties present for the visit unless noted in HPI below.  History of Present Illness: The patient is a 57 y.o. female with visit for ongoing sore throat 1-2 weeks. Took amoxicillin 1 week no improvement. Gets strep yearly and feels same. Pain and swelling in neck which has not improved. Took 2 covid-19 tests at onset negative. Also having hair thinning with mounjaro 15 mg weekly but is finally getting weight loss with it.  Observations/Objective: Appearance: appears to be feeling unwell, breathing appears normal no cough during visit, casual grooming, abdomen does not appear distended, throat not well visualized due to poor video quality but no obvious white patches  Assessment and Plan: See problem oriented charting  Follow Up Instructions: rx cefdinir 10 day course in case of treatment failure, checking labs for follow up diabetes and investigate new hair thinning.  I discussed the assessment and treatment plan with the patient. The patient was provided an opportunity to ask questions and all were answered. The patient agreed with the plan and demonstrated an understanding of the instructions.   The patient was advised to call back or seek an in-person evaluation if the symptoms worsen or if the condition fails to improve as anticipated.  Myrlene Broker, MD

## 2023-03-03 ENCOUNTER — Other Ambulatory Visit: Payer: Self-pay | Admitting: Internal Medicine

## 2023-03-31 ENCOUNTER — Other Ambulatory Visit: Payer: Self-pay | Admitting: Internal Medicine

## 2023-04-23 ENCOUNTER — Other Ambulatory Visit: Payer: Self-pay | Admitting: Internal Medicine

## 2023-05-09 ENCOUNTER — Other Ambulatory Visit: Payer: Self-pay | Admitting: Internal Medicine

## 2023-06-04 ENCOUNTER — Other Ambulatory Visit: Payer: Self-pay | Admitting: Internal Medicine

## 2023-06-08 ENCOUNTER — Other Ambulatory Visit: Payer: Self-pay | Admitting: Internal Medicine

## 2023-06-15 ENCOUNTER — Ambulatory Visit: Payer: Self-pay | Admitting: Internal Medicine

## 2023-06-15 ENCOUNTER — Telehealth (INDEPENDENT_AMBULATORY_CARE_PROVIDER_SITE_OTHER): Payer: 59 | Admitting: Nurse Practitioner

## 2023-06-15 VITALS — BP 128/88 | Ht 65.0 in | Wt 217.0 lb

## 2023-06-15 DIAGNOSIS — T3695XA Adverse effect of unspecified systemic antibiotic, initial encounter: Secondary | ICD-10-CM | POA: Diagnosis not present

## 2023-06-15 DIAGNOSIS — B379 Candidiasis, unspecified: Secondary | ICD-10-CM

## 2023-06-15 DIAGNOSIS — J0100 Acute maxillary sinusitis, unspecified: Secondary | ICD-10-CM | POA: Diagnosis not present

## 2023-06-15 MED ORDER — FLUCONAZOLE 150 MG PO TABS: ORAL_TABLET | ORAL | 0 refills | Status: DC

## 2023-06-15 MED ORDER — PSEUDOEPH-BROMPHEN-DM 30-2-10 MG/5ML PO SYRP: 5.0000 mL | ORAL_SOLUTION | Freq: Four times a day (QID) | ORAL | 0 refills | Status: DC | PRN

## 2023-06-15 MED ORDER — METHYLPREDNISOLONE 4 MG PO TBPK: ORAL_TABLET | ORAL | 0 refills | Status: DC

## 2023-06-15 MED ORDER — DOXYCYCLINE HYCLATE 100 MG PO TABS: 100.0000 mg | ORAL_TABLET | Freq: Two times a day (BID) | ORAL | 0 refills | Status: DC

## 2023-06-15 NOTE — Telephone Encounter (Signed)
Chief Complaint: Sinus pressure pain, yellow nasal discharge, cough Frequency: Constant for two days Pertinent Negatives: Patient denies fever Disposition: [] ED /[] Urgent Care (no appt availability in office) / [x] Appointment(In office/virtual)/ []  [facility]/ [] Home Care/ [] Refused Recommended Disposition /[] [facility]/ []  Follow-up with PCP Additional Notes: Patient called in stating she was seen in [facility] about 10 days ago for a sinus infection and has completed entire round of Amoxicillin. Patient states as of two days ago, she is having worsening sinus pain and pressure, and yellow nasal drainage, and continued cough. Patient requesting further evaluation to determine if more abx are needed at this time. Virtual appointed with HCP created and confirmed for today.   Copied from CRM [id number]. Topic: Clinical - Medical Advice >> Jun 15, 2023 11:25 AM Elizebeth Brooking wrote: Reason for CRM: Patient called in regarding having Sinus Infection Face Swollen, [facility] and its not working would like for a nurse to call on what to do as there isnt no available appointments until next week Reason for Disposition  [1] Sinus congestion (pressure, fullness) AND [2] present > 10 days  Answer Assessment - Initial Assessment Questions 1. LOCATION: "Where does it hurt?"      Whole face 2. ONSET: "When did the sinus pain start?"  (e.g., hours, days)      Two days 3. SEVERITY: "How bad is the pain?"   (Scale 1-10; mild, moderate or severe)   - MILD (1-3): doesn't interfere with normal activities    - MODERATE (4-7): interferes with normal activities (e.g., work or school) or awakens from sleep   - SEVERE (8-10): excruciating pain and patient unable to do any normal activities        When I blow my nose its a 10 5. NASAL CONGESTION: "Is the nose blocked?" If Yes, ask: "Can you open it or must you breathe through your mouth?"     Yes 6. NASAL  DISCHARGE: "Do you have discharge from your nose?" If so ask, "What color?"     Yellow as of last night 7. FEVER: "Do you have a fever?" If Yes, ask: "What is it, how was it measured, and when did it start?"      No 8. OTHER SYMPTOMS: "Do you have any other symptoms?" (e.g., sore throat, cough, earache, difficulty breathing)     Cough  Protocols used: Sinus Pain or Congestion-A-AH

## 2023-06-15 NOTE — Progress Notes (Signed)
MyChart Video Visit    Virtual Visit via Video Note   This visit type was conducted because this format is felt to be most appropriate for this patient at this time. Physical exam was limited by quality of the video and audio technology used for the visit. CMA was able to get the patient set up on a video visit.  Patient location: Home. Patient and provider in visit Provider location: Office  I discussed the limitations of evaluation and management by telemedicine and the availability of in person appointments. The patient expressed understanding and agreed to proceed.  Visit Date: 06/15/2023  Today's healthcare provider: Bethanie Dicker, NP     Subjective:    Patient ID: Lori Levy, female    DOB: 1965/07/03, 58 y.o.   MRN: [id number]  Chief Complaint  Patient presents with   Nasal Congestion    Been on abx for a week pressure in face, headache, cough Denies chest pain, sob   Sx's started a week ago.    HPI  Discussed the use of AI scribe software for clinical note transcription with the patient, who gave verbal consent to proceed.  History of Present Illness   The patient, initially presented to urgent care approximately ten days prior with a sinus infection. She was prescribed a seven-day course of Augmentin, which she completed. Despite this, she continues to experience symptoms. The patient reports a persistent cough, though it has improved somewhat. She describes a sensation of intense pressure in her face, likening it to "a ton of cement," which is exacerbated when she blows her nose. The patient notes a change in her mucus from clear to yellow. She confirms that the congestion is localized to her sinuses, with no chest involvement. She also reports postnasal drainage but denies any associated sore throat or shortness of breath. She has not experienced any fevers. While she occasionally experiences fatigue and body aches, these symptoms are not consistent. The patient  also reports headaches, which she attributes to the sinus pressure. She denies any nausea or vomiting and has not been exposed to any known triggers.  The patient's symptoms have been ongoing for approximately three weeks, beginning a week prior to her urgent care visit. She notes that her face appears swollen, particularly under her eyes. Over-the-counter treatments, including Coricidin and Zyrtec, have not provided relief.  The patient has a past medical history of hypertension and diabetes, for which she takes blood pressure medication and Mounjaro, respectively. She monitors her blood pressure at home, which has remained stable and well-controlled on her current regimen.  The patient has been using Flonase and inquired about continuing its use. She has a known allergy to clarithromycin.      Past Medical History:  Diagnosis Date   Amenorrhea    Anemia    Hypertension    Menorrhagia    Migraine     Past Surgical History:  Procedure Laterality Date   ENDOMETRIAL ABLATION  2011   VAGINAL HYSTERECTOMY  2012    Family History  Problem Relation Age of Onset   Breast cancer Neg Hx     Social History   Socioeconomic History   Marital status: Married    Spouse name: Not on file   Number of children: Not on file   Years of education: Not on file   Highest education level: Not on file  Occupational History   Not on file  Tobacco Use   Smoking status: Never   Smokeless tobacco:  Never  Vaping Use   Vaping status: Never Used  Substance and Sexual Activity   Alcohol use: Yes    Comment: OCCASIONALLY/SR   Drug use: Not on file   Sexual activity: Yes    Birth control/protection: Surgical  Other Topics Concern   Not on file  Social History Narrative   Not on file   Social Drivers of Health   Financial Resource Strain: Not on file  Food Insecurity: Not on file  Transportation Needs: Not on file  Physical Activity: Not on file  Stress: Not on file  Social Connections:  Unknown (08/16/2019)   Received from [facility], [facility]   Social Connections    Frequency of Communication with Friends and Family: Not asked    Frequency of Social Gatherings with Friends and Family: Not asked  Intimate Partner Violence: Unknown (08/16/2019)   Received from [facility], [facility]   Intimate Partner Violence    Fear of Current or Ex-Partner: Not asked    Emotionally Abused: Not asked    Physically Abused: Not asked    Sexually Abused: Not asked    Outpatient Medications Prior to Visit  Medication Sig Dispense Refill   amLODipine (NORVASC) 10 MG tablet Take 1 tablet (10 mg total) by mouth daily as needed (BP >150 systolic or >95 diastolic). 90 tablet 3   fluticasone (FLONASE) 50 MCG/ACT nasal spray Place 2 sprays into both nostrils daily. 48 mL 2   hydrochlorothiazide (HYDRODIURIL) 25 MG tablet Take 1 tablet (25 mg total) by mouth daily. 90 tablet 3   hydrOXYzine (ATARAX) 50 MG tablet TAKE 1 TABLET BY MOUTH THREE TIMES A DAY AS NEEDED 30 tablet 1   losartan (COZAAR) 100 MG tablet Take 1 tablet (100 mg total) by mouth daily. 90 tablet 3   metFORMIN (GLUCOPHAGE-XR) 500 MG 24 hr tablet TAKE 1 TABLET BY MOUTH EVERY DAY WITH BREAKFAST 90 tablet 1   pravastatin (PRAVACHOL) 20 MG tablet TAKE 1 TABLET DAILY. FOLLOW-UP APPT DUE IN SEPT MUST SEE PROVIDER FOR FUTURE REFILLS 30 tablet 0   tirzepatide (MOUNJARO) 15 MG/0.5ML Pen Inject 15 mg into the skin once a week. 6 mL 1   cyanocobalamin (VITAMIN B12) 1000 MCG tablet Take 1 tablet (1,000 mcg total) by mouth daily. 90 tablet 3   escitalopram (LEXAPRO) 10 MG tablet Take 1 tablet (10 mg total) by mouth daily. 90 tablet 3   Multiple Vitamins-Minerals (MULTIVITAMIN WITH MINERALS) tablet Take 1 tablet by mouth daily.     Rimegepant Sulfate (NURTEC) 75 MG TBDP Take 1 tablet by mouth every other day as needed (migraine). 8 tablet 1   tretinoin (RETIN-A) 0.1 % cream Apply topically at bedtime. 45 g 0   triamcinolone (KENALOG)  0.1 % Apply 1 application topically 2 (two) times daily. 100 g 2   Vitamin A (RETINOL MOLECULAR FILM) 0.3 % OIL Use topically on leg twice a day. 1 g 11   cefdinir (OMNICEF) 300 MG capsule Take 1 capsule (300 mg total) by mouth 2 (two) times daily. 20 capsule 0   fluconazole (DIFLUCAN) 150 MG tablet Take 1 tablet PO once. Repeat in 3 days if needed. 2 tablet 0   promethazine-dextromethorphan (PROMETHAZINE-DM) 6.25-15 MG/5ML syrup Take 5 mLs by mouth 4 (four) times daily as needed. 118 mL 0   No facility-administered medications prior to visit.    Allergies  Allergen Reactions   Clarithromycin    Clarithromycin Nausea Only    Other reaction(s): Dizziness, Headache, Itching-Allergy Headaches Chills,  headache     ROS See HPI    Objective:    Physical Exam  BP 128/88 Comment: pt reported  Ht 5\' 5"  (1.651 m)   Wt 217 lb (98.4 kg)   BMI 36.11 kg/m  Wt Readings from Last 3 Encounters:  06/15/23 217 lb (98.4 kg)  06/21/22 217 lb 8 oz (98.7 kg)  03/16/22 223 lb (101.2 kg)   GENERAL: alert, oriented, appears well and in no acute distress   HEENT: atraumatic, conjunttiva clear, no obvious abnormalities on inspection of external nose and ears   NECK: normal movements of the head and neck   LUNGS: on inspection no signs of respiratory distress, breathing rate appears normal, no obvious gross SOB, gasping or wheezing   CV: no obvious cyanosis   MS: moves all visible extremities without noticeable abnormality   PSYCH/NEURO: pleasant and cooperative, no obvious depression or anxiety, speech and thought processing grossly intact    Assessment & Plan:   Problem List Items Addressed This Visit       Respiratory   Acute non-recurrent maxillary sinusitis - Primary   Persistent sinus congestion and facial pain continue despite completing Augmentin. Yellow mucus and postnasal drainage are present, with no fever or shortness of breath. Symptoms have persisted for about 3 weeks. Start  Doxycycline 100mg  twice daily for 7 days and a Methylprednisolone dose pack. Use Bromfed DM cough syrup, 5-21ml every 4-6 hours as needed for cough. Counseled on common side effects of medications. Continue Flonase as previously directed. Encouraged adequate hydration. Discussed need for in-person evaluation if symptoms persist after treatment.      Relevant Medications   doxycycline (VIBRA-TABS) 100 MG tablet   methylPREDNISolone (MEDROL DOSEPAK) 4 MG TBPK tablet   brompheniramine-pseudoephedrine-DM 30-2-10 MG/5ML syrup   fluconazole (DIFLUCAN) 150 MG tablet   Other Visit Diagnoses       Antibiotic-induced yeast infection       Relevant Medications   fluconazole (DIFLUCAN) 150 MG tablet       I have discontinued Richardo Priest. Schetter's fluconazole, cefdinir, and promethazine-dextromethorphan. I am also having her start on doxycycline, methylPREDNISolone, brompheniramine-pseudoephedrine-DM, and fluconazole. Additionally, I am having her maintain her multivitamin with minerals, Retinol Molecular Film, triamcinolone cream, tretinoin, Nurtec, cyanocobalamin, amLODipine, hydrochlorothiazide, losartan, tirzepatide, fluticasone, escitalopram, hydrOXYzine, pravastatin, and metFORMIN.  Meds ordered this encounter  Medications   doxycycline (VIBRA-TABS) 100 MG tablet    Sig: Take 1 tablet (100 mg total) by mouth 2 (two) times daily.    Dispense:  14 tablet    Refill:  0    Supervising Provider:   Birdie Sons, ERIC G [[id number]]   methylPREDNISolone (MEDROL DOSEPAK) 4 MG TBPK tablet    Sig: Take as directed.    Dispense:  21 each    Refill:  0    Supervising Provider:   Birdie Sons, ERIC G [[id number]]   brompheniramine-pseudoephedrine-DM 30-2-10 MG/5ML syrup    Sig: Take 5-10 mLs by mouth every 6 (six) hours as needed.    Dispense:  120 mL    Refill:  0    Supervising Provider:   Birdie Sons, ERIC G [[id number]]   fluconazole (DIFLUCAN) 150 MG tablet    Sig: Take 1 tablet by mouth x 1 dose. May repeat in 72  hours if needed.    Dispense:  2 tablet    Refill:  0    Supervising Provider:   Birdie Sons, ERIC G [[id number]]   I discussed the assessment and treatment plan with the patient. The patient was  provided an opportunity to ask questions and all were answered. The patient agreed with the plan and demonstrated an understanding of the instructions.   The patient was advised to call back or seek an in-person evaluation if the symptoms worsen or if the condition fails to improve as anticipated.   Bethanie Dicker, NP [facility] [phone] (phone) [phone] (fax)  [facility]

## 2023-06-15 NOTE — Assessment & Plan Note (Addendum)
Persistent sinus congestion and facial pain continue despite completing Augmentin. Yellow mucus and postnasal drainage are present, with no fever or shortness of breath. Symptoms have persisted for about 3 weeks. Start Doxycycline 100mg  twice daily for 7 days and a Methylprednisolone dose pack. Use Bromfed DM cough syrup, 5-64ml every 4-6 hours as needed for cough. Counseled on common side effects of medications. Continue Flonase as previously directed. Encouraged adequate hydration. Discussed need for in-person evaluation if symptoms persist after treatment.

## 2023-06-17 ENCOUNTER — Other Ambulatory Visit: Payer: Self-pay | Admitting: Internal Medicine

## 2023-06-21 ENCOUNTER — Other Ambulatory Visit: Payer: 59

## 2023-06-21 DIAGNOSIS — I10 Essential (primary) hypertension: Secondary | ICD-10-CM

## 2023-06-21 DIAGNOSIS — E1169 Type 2 diabetes mellitus with other specified complication: Secondary | ICD-10-CM

## 2023-06-21 DIAGNOSIS — E785 Hyperlipidemia, unspecified: Secondary | ICD-10-CM

## 2023-06-21 DIAGNOSIS — L659 Nonscarring hair loss, unspecified: Secondary | ICD-10-CM | POA: Diagnosis not present

## 2023-06-21 DIAGNOSIS — E118 Type 2 diabetes mellitus with unspecified complications: Secondary | ICD-10-CM | POA: Diagnosis not present

## 2023-06-21 DIAGNOSIS — N951 Menopausal and female climacteric states: Secondary | ICD-10-CM | POA: Diagnosis not present

## 2023-06-21 DIAGNOSIS — G43119 Migraine with aura, intractable, without status migrainosus: Secondary | ICD-10-CM

## 2023-06-21 LAB — COMPREHENSIVE METABOLIC PANEL
ALT: 20 U/L (ref 0–35)
AST: 20 U/L (ref 0–37)
Albumin: 4.7 g/dL (ref 3.5–5.2)
Alkaline Phosphatase: 95 U/L (ref 39–117)
BUN: 27 mg/dL — ABNORMAL HIGH (ref 6–23)
CO2: 34 meq/L — ABNORMAL HIGH (ref 19–32)
Calcium: 10.2 mg/dL (ref 8.4–10.5)
Chloride: 99 meq/L (ref 96–112)
Creatinine, Ser: 0.88 mg/dL (ref 0.40–1.20)
GFR: 73.9 mL/min (ref >60.00)
Glucose, Bld: 111 mg/dL — ABNORMAL HIGH (ref 70–99)
Potassium: 4.3 meq/L (ref 3.5–5.1)
Sodium: 141 meq/L (ref 135–145)
Total Bilirubin: 0.5 mg/dL (ref 0.2–1.2)
Total Protein: 7.8 g/dL (ref 6.0–8.3)

## 2023-06-21 LAB — LIPID PANEL
Cholesterol: 227 mg/dL — ABNORMAL HIGH (ref 0–200)
HDL: 100.4 mg/dL (ref >39.00)
LDL Cholesterol: 115 mg/dL — ABNORMAL HIGH (ref 0–99)
NonHDL: 126.78
Total CHOL/HDL Ratio: 2
Triglycerides: 59 mg/dL (ref 0.0–149.0)
VLDL: 11.8 mg/dL (ref 0.0–40.0)

## 2023-06-21 LAB — CBC WITH DIFFERENTIAL/PLATELET
Basophils Absolute: 0.1 10*3/uL (ref 0.0–0.1)
Basophils Relative: 0.6 % (ref 0.0–3.0)
Eosinophils Absolute: 0 10*3/uL (ref 0.0–0.7)
Eosinophils Relative: 0.3 % (ref 0.0–5.0)
HCT: 43.2 % (ref 36.0–46.0)
Hemoglobin: 13.6 g/dL (ref 12.0–15.0)
Lymphocytes Relative: 41.9 % (ref 12.0–46.0)
Lymphs Abs: 4.6 10*3/uL — ABNORMAL HIGH (ref 0.7–4.0)
MCHC: 31.6 g/dL (ref 30.0–36.0)
MCV: 92.3 fL (ref 78.0–100.0)
Monocytes Absolute: 0.9 10*3/uL (ref 0.1–1.0)
Monocytes Relative: 8.2 % (ref 3.0–12.0)
Neutro Abs: 5.4 10*3/uL (ref 1.4–7.7)
Neutrophils Relative %: 49 % (ref 43.0–77.0)
Platelets: 383 10*3/uL (ref 150.0–400.0)
RBC: 4.68 Mil/uL (ref 3.87–5.11)
RDW: 16.8 % — ABNORMAL HIGH (ref 11.5–15.5)
WBC: 11.1 10*3/uL — ABNORMAL HIGH (ref 4.0–10.5)

## 2023-06-21 LAB — VITAMIN B12: Vitamin B-12: 259 pg/mL (ref 211–911)

## 2023-06-21 LAB — MICROALBUMIN / CREATININE URINE RATIO
Creatinine,U: 223.9 mg/dL
Microalb Creat Ratio: 5.1 mg/g (ref 0.0–30.0)
Microalb, Ur: 11.5 mg/dL — ABNORMAL HIGH (ref 0.0–1.9)

## 2023-06-21 LAB — VITAMIN D 25 HYDROXY (VIT D DEFICIENCY, FRACTURES): VITD: 12.76 ng/mL — ABNORMAL LOW (ref 30.00–100.00)

## 2023-06-21 LAB — TSH: TSH: 1.75 u[IU]/mL (ref 0.35–5.50)

## 2023-06-21 LAB — HEMOGLOBIN A1C: Hgb A1c MFr Bld: 6.2 % (ref 4.6–6.5)

## 2023-06-25 ENCOUNTER — Other Ambulatory Visit: Payer: Self-pay | Admitting: Internal Medicine

## 2023-06-25 MED ORDER — PRAVASTATIN SODIUM 20 MG PO TABS: 20.0000 mg | ORAL_TABLET | Freq: Every day | ORAL | 0 refills | Status: DC

## 2023-06-25 NOTE — Telephone Encounter (Signed)
I did not see monjurno on patients medication list

## 2023-06-26 ENCOUNTER — Other Ambulatory Visit: Payer: Self-pay

## 2023-06-26 MED ORDER — TIRZEPATIDE 15 MG/0.5ML ~~LOC~~ SOAJ: 15.0000 mg | SUBCUTANEOUS | 1 refills | Status: DC

## 2023-06-26 NOTE — Telephone Encounter (Signed)
Ok to refill leaxapro

## 2023-06-27 ENCOUNTER — Other Ambulatory Visit: Payer: Self-pay | Admitting: Internal Medicine

## 2023-06-27 ENCOUNTER — Other Ambulatory Visit: Payer: Self-pay

## 2023-06-27 MED ORDER — TIRZEPATIDE 15 MG/0.5ML ~~LOC~~ SOAJ: 15.0000 mg | SUBCUTANEOUS | 1 refills | Status: DC

## 2023-06-27 MED ORDER — ESCITALOPRAM OXALATE 10 MG PO TABS: 10.0000 mg | ORAL_TABLET | Freq: Every day | ORAL | 1 refills | Status: DC

## 2023-06-29 ENCOUNTER — Other Ambulatory Visit: Payer: Self-pay | Admitting: Internal Medicine

## 2023-06-29 ENCOUNTER — Other Ambulatory Visit: Payer: Self-pay

## 2023-06-29 ENCOUNTER — Encounter: Payer: Self-pay | Admitting: Internal Medicine

## 2023-06-29 MED ORDER — VITAMIN D (ERGOCALCIFEROL) 1.25 MG (50000 UNIT) PO CAPS: 50000.0000 [IU] | ORAL_CAPSULE | ORAL | 0 refills | Status: AC

## 2023-06-29 MED ORDER — PRAVASTATIN SODIUM 20 MG PO TABS: 20.0000 mg | ORAL_TABLET | Freq: Every day | ORAL | 0 refills | Status: DC

## 2023-06-29 NOTE — Telephone Encounter (Signed)
Please correct patient medication

## 2023-07-03 ENCOUNTER — Other Ambulatory Visit: Payer: Self-pay

## 2023-07-03 MED ORDER — HYDROXYZINE HCL 50 MG PO TABS: 50.0000 mg | ORAL_TABLET | Freq: Three times a day (TID) | ORAL | 1 refills | Status: AC | PRN

## 2023-07-03 MED ORDER — PRAVASTATIN SODIUM 20 MG PO TABS: 20.0000 mg | ORAL_TABLET | Freq: Every day | ORAL | 0 refills | Status: DC

## 2023-07-14 ENCOUNTER — Other Ambulatory Visit: Payer: Self-pay | Admitting: Internal Medicine

## 2023-08-16 ENCOUNTER — Encounter: Payer: Self-pay | Admitting: Internal Medicine

## 2023-08-28 MED ORDER — EMPAGLIFLOZIN 10 MG PO TABS: 10.0000 mg | ORAL_TABLET | Freq: Every day | ORAL | 1 refills | Status: DC

## 2023-09-23 ENCOUNTER — Other Ambulatory Visit: Payer: Self-pay | Admitting: Internal Medicine

## 2023-09-27 ENCOUNTER — Other Ambulatory Visit: Payer: Self-pay | Admitting: Internal Medicine

## 2023-11-30 ENCOUNTER — Other Ambulatory Visit: Payer: Self-pay | Admitting: Internal Medicine

## 2023-12-18 ENCOUNTER — Telehealth: Admitting: Family Medicine

## 2023-12-18 DIAGNOSIS — B379 Candidiasis, unspecified: Secondary | ICD-10-CM | POA: Diagnosis not present

## 2023-12-18 DIAGNOSIS — Z20818 Contact with and (suspected) exposure to other bacterial communicable diseases: Secondary | ICD-10-CM

## 2023-12-18 DIAGNOSIS — J020 Streptococcal pharyngitis: Secondary | ICD-10-CM

## 2023-12-18 DIAGNOSIS — T3695XA Adverse effect of unspecified systemic antibiotic, initial encounter: Secondary | ICD-10-CM | POA: Diagnosis not present

## 2023-12-18 MED ORDER — AMOXICILLIN 875 MG PO TABS: 875.0000 mg | ORAL_TABLET | Freq: Two times a day (BID) | ORAL | 0 refills | Status: AC

## 2023-12-18 MED ORDER — FLUCONAZOLE 150 MG PO TABS: ORAL_TABLET | ORAL | 0 refills | Status: DC

## 2023-12-18 MED ORDER — BENZONATATE 200 MG PO CAPS: 200.0000 mg | ORAL_CAPSULE | Freq: Two times a day (BID) | ORAL | 0 refills | Status: DC | PRN

## 2023-12-18 NOTE — Patient Instructions (Signed)

## 2023-12-18 NOTE — Progress Notes (Signed)
 Virtual Visit Consent   MADOLIN TWADDLE, you are scheduled for a virtual visit with a [facility] provider today. Just as with appointments in the office, your consent must be obtained to participate. Your consent will be active for this visit and any virtual visit you may have with one of our providers in the next 365 days. If you have a MyChart account, a copy of this consent can be sent to you electronically.  As this is a virtual visit, video technology does not allow for your provider to perform a traditional examination. This may limit your provider's ability to fully assess your condition. If your provider identifies any concerns that need to be evaluated in person or the need to arrange testing (such as labs, EKG, etc.), we will make arrangements to do so. Although advances in technology are sophisticated, we cannot ensure that it will always work on either your end or our end. If the connection with a video visit is poor, the visit may have to be switched to a telephone visit. With either a video or telephone visit, we are not always able to ensure that we have a secure connection.  By engaging in this virtual visit, you consent to the provision of healthcare and authorize for your insurance to be billed (if applicable) for the services provided during this visit. Depending on your insurance coverage, you may receive a charge related to this service.  I need to obtain your verbal consent now. Are you willing to proceed with your visit today? Lori Levy has provided verbal consent on 12/18/2023 for a virtual visit (video or telephone). Loa Lamp, FNP  Date: 12/18/2023 4:16 PM   Virtual Visit via Video Note   I, Loa Lamp, connected with  Lori Levy  ([id number], 1965-07-07) on 12/18/23 at  4:15 PM EDT by a video-enabled telemedicine application and verified that I am speaking with the correct person using two identifiers.  Location: Patient: Virtual Visit Location Patient:  Home Provider: Virtual Visit Location Provider: Home Office   I discussed the limitations of evaluation and management by telemedicine and the availability of in person appointments. The patient expressed understanding and agreed to proceed.    History of Present Illness: Lori Levy is a 58 y.o. who identifies as a female who was assigned female at birth, and is being seen today for sore throat for 1 day with fever, nausea, headache, and a cough. She was exposed to her mother who also has strep.Lori Levy  HPI: HPI  Problems:  Patient Active Problem List   Diagnosis Date Noted   Acute non-recurrent maxillary sinusitis 06/15/2023   Sore throat 02/27/2023   Hair thinning 02/27/2023   Hyperlipidemia associated with type 2 diabetes mellitus (HCC) 03/16/2022   Type 2 diabetes with complication (HCC) 02/16/2022   Intractable migraine with aura without status migrainosus 02/16/2022   Encounter for screening mammogram for malignant neoplasm of breast 02/16/2022   Chronic cough 02/02/2021   Morbid obesity (HCC) 07/13/2020   Hot flashes due to menopause 06/22/2020   Hypertension     Allergies:  Allergies  Allergen Reactions   Clarithromycin    Clarithromycin Nausea Only    Other reaction(s): Dizziness, Headache, Itching-Allergy Headaches Chills, headache    Medications:  Current Outpatient Medications:    amoxicillin  (AMOXIL ) 875 MG tablet, Take 1 tablet (875 mg total) by mouth 2 (two) times daily for 10 days., Disp: 20 tablet, Rfl: 0   benzonatate  (TESSALON ) 200 MG capsule, Take 1  capsule (200 mg total) by mouth 2 (two) times daily as needed for cough., Disp: 20 capsule, Rfl: 0   amLODipine  (NORVASC ) 10 MG tablet, Take 1 tablet (10 mg total) by mouth daily as needed (BP >150 systolic or >95 diastolic)., Disp: 90 tablet, Rfl: 3   brompheniramine-pseudoephedrine-DM 30-2-10 MG/5ML syrup, Take 5-10 mLs by mouth every 6 (six) hours as needed., Disp: 120 mL, Rfl: 0   cyanocobalamin  (VITAMIN B12)  1000 MCG tablet, Take 1 tablet (1,000 mcg total) by mouth daily., Disp: 90 tablet, Rfl: 3   doxycycline  (VIBRA -TABS) 100 MG tablet, Take 1 tablet (100 mg total) by mouth 2 (two) times daily., Disp: 14 tablet, Rfl: 0   empagliflozin  (JARDIANCE ) 10 MG TABS tablet, Take 1 tablet (10 mg total) by mouth daily before breakfast., Disp: 90 tablet, Rfl: 1   escitalopram  (LEXAPRO ) 10 MG tablet, Take 1 tablet (10 mg total) by mouth daily., Disp: 90 tablet, Rfl: 1   fluconazole  (DIFLUCAN ) 150 MG tablet, Take 1 tablet by mouth x 1 dose. May repeat in 72 hours if needed., Disp: 2 tablet, Rfl: 0   fluticasone  (FLONASE ) 50 MCG/ACT nasal spray, Place 2 sprays into both nostrils daily., Disp: 48 mL, Rfl: 2   hydrochlorothiazide  (HYDRODIURIL ) 25 MG tablet, TAKE 1 TABLET (25 MG TOTAL) BY MOUTH DAILY., Disp: 90 tablet, Rfl: 0   hydrOXYzine  (ATARAX ) 50 MG tablet, Take 1 tablet (50 mg total) by mouth 3 (three) times daily as needed., Disp: 30 tablet, Rfl: 1   losartan  (COZAAR ) 100 MG tablet, TAKE 1 TABLET BY MOUTH EVERY DAY, Disp: 90 tablet, Rfl: 0   metFORMIN  (GLUCOPHAGE -XR) 500 MG 24 hr tablet, TAKE 1 TABLET BY MOUTH EVERY DAY WITH BREAKFAST, Disp: 90 tablet, Rfl: 0   methylPREDNISolone  (MEDROL  DOSEPAK) 4 MG TBPK tablet, Take as directed., Disp: 21 each, Rfl: 0   Multiple Vitamins-Minerals (MULTIVITAMIN WITH MINERALS) tablet, Take 1 tablet by mouth daily., Disp: , Rfl:    pravastatin  (PRAVACHOL ) 20 MG tablet, Take 1 tablet (20 mg total) by mouth daily., Disp: 90 tablet, Rfl: 0   Rimegepant Sulfate (NURTEC) 75 MG TBDP, Take 1 tablet by mouth every other day as needed (migraine)., Disp: 8 tablet, Rfl: 1   tirzepatide  (MOUNJARO ) 15 MG/0.5ML Pen, Inject 15 mg into the skin once a week., Disp: 6 mL, Rfl: 1   tretinoin  (RETIN-A ) 0.1 % cream, Apply topically at bedtime., Disp: 45 g, Rfl: 0   triamcinolone  (KENALOG ) 0.1 %, Apply 1 application topically 2 (two) times daily., Disp: 100 g, Rfl: 2   Vitamin A (RETINOL MOLECULAR  FILM) 0.3 % OIL, Use topically on leg twice a day., Disp: 1 g, Rfl: 11   Vitamin D , Ergocalciferol , (DRISDOL ) 1.25 MG (50000 UNIT) CAPS capsule, Take 1 capsule (50,000 Units total) by mouth every 7 (seven) days., Disp: 12 capsule, Rfl: 0  Observations/Objective: Patient is well-developed, well-nourished in no acute distress.  Resting comfortably  at home.  Head is normocephalic, atraumatic.  No labored breathing.  Speech is clear and coherent with logical content.  Patient is alert and oriented at baseline.    Assessment and Plan: 1. Pharyngitis due to Streptococcus species (Primary)  2. Exposure to strep throat  3. Antibiotic-induced yeast infection - fluconazole  (DIFLUCAN ) 150 MG tablet; Take 1 tablet by mouth x 1 dose. May repeat in 72 hours if needed.  Dispense: 2 tablet; Refill: 0  Increase fluids warm salt water gargles, ibuprofen as directed, UC as needed.   Follow Up Instructions: I discussed the  assessment and treatment plan with the patient. The patient was provided an opportunity to ask questions and all were answered. The patient agreed with the plan and demonstrated an understanding of the instructions.  A copy of instructions were sent to the patient via MyChart unless otherwise noted below.   Patient has requested to receive PHI (AVS, Work Notes, etc) pertaining to this video visit through e-mail as they are currently without active MyChart. They have voiced understand that email is not considered secure and their health information could be viewed by someone other than the patient.   The patient was advised to call back or seek an in-person evaluation if the symptoms worsen or if the condition fails to improve as anticipated.    Sahory Nordling, FNP

## 2023-12-27 ENCOUNTER — Other Ambulatory Visit: Payer: Self-pay | Admitting: Internal Medicine

## 2023-12-28 ENCOUNTER — Other Ambulatory Visit: Payer: Self-pay | Admitting: Internal Medicine

## 2024-02-25 ENCOUNTER — Other Ambulatory Visit: Payer: Self-pay | Admitting: Internal Medicine

## 2024-02-26 NOTE — Telephone Encounter (Signed)
 Please call and schedule visit due for yearly last seen 02/2023

## 2024-02-29 ENCOUNTER — Other Ambulatory Visit: Payer: Self-pay | Admitting: Internal Medicine

## 2024-02-29 NOTE — Telephone Encounter (Signed)
 No visit in >1 year did you make apt for her?

## 2024-03-22 ENCOUNTER — Other Ambulatory Visit: Payer: Self-pay | Admitting: Internal Medicine

## 2024-05-11 ENCOUNTER — Other Ambulatory Visit: Payer: Self-pay | Admitting: Internal Medicine

## 2024-05-18 ENCOUNTER — Other Ambulatory Visit: Payer: Self-pay | Admitting: Internal Medicine

## 2024-05-26 ENCOUNTER — Other Ambulatory Visit: Payer: Self-pay | Admitting: Internal Medicine

## 2024-05-30 ENCOUNTER — Other Ambulatory Visit: Payer: Self-pay

## 2024-05-30 ENCOUNTER — Telehealth: Payer: Self-pay

## 2024-05-30 DIAGNOSIS — I10 Essential (primary) hypertension: Secondary | ICD-10-CM

## 2024-05-30 MED ORDER — TIRZEPATIDE 15 MG/0.5ML ~~LOC~~ SOAJ: 15.0000 mg | SUBCUTANEOUS | 0 refills | Status: AC

## 2024-05-30 MED ORDER — LOSARTAN POTASSIUM 100 MG PO TABS: 100.0000 mg | ORAL_TABLET | Freq: Every day | ORAL | 0 refills | Status: DC

## 2024-05-30 NOTE — Telephone Encounter (Signed)
 Copied from CRM [id number]. Topic: Clinical - Prescription Issue >> May 30, 2024 12:33 PM Jasmin G wrote: Reason for CRM: Pt requested for Tirzepatide  15 MG/0.5ML INJECT 15 MG and Losartan  Potassium 100 mg Oral to be refilled today if possible as she states that she has been out of med for about a week.

## 2024-05-30 NOTE — Telephone Encounter (Signed)
 Spoke with pt and needed OV. Scheduled for 06/09/24@840am . 30 day supply sent in to cover pt until she can be seen. Pt is aware.

## 2024-06-09 ENCOUNTER — Ambulatory Visit: Admitting: Internal Medicine

## 2024-06-10 ENCOUNTER — Ambulatory Visit (INDEPENDENT_AMBULATORY_CARE_PROVIDER_SITE_OTHER): Admitting: Internal Medicine

## 2024-06-10 ENCOUNTER — Encounter: Payer: Self-pay | Admitting: Internal Medicine

## 2024-06-10 VITALS — BP 130/78 | HR 100 | Temp 98.6°F | Ht 65.0 in | Wt 182.6 lb

## 2024-06-10 DIAGNOSIS — E785 Hyperlipidemia, unspecified: Secondary | ICD-10-CM | POA: Diagnosis not present

## 2024-06-10 DIAGNOSIS — I10 Essential (primary) hypertension: Secondary | ICD-10-CM

## 2024-06-10 DIAGNOSIS — E118 Type 2 diabetes mellitus with unspecified complications: Secondary | ICD-10-CM | POA: Diagnosis not present

## 2024-06-10 DIAGNOSIS — E1169 Type 2 diabetes mellitus with other specified complication: Secondary | ICD-10-CM | POA: Diagnosis not present

## 2024-06-10 DIAGNOSIS — Z0000 Encounter for general adult medical examination without abnormal findings: Secondary | ICD-10-CM | POA: Diagnosis not present

## 2024-06-10 DIAGNOSIS — Z1211 Encounter for screening for malignant neoplasm of colon: Secondary | ICD-10-CM

## 2024-06-10 DIAGNOSIS — Z23 Encounter for immunization: Secondary | ICD-10-CM | POA: Diagnosis not present

## 2024-06-10 DIAGNOSIS — Z7985 Long-term (current) use of injectable non-insulin antidiabetic drugs: Secondary | ICD-10-CM | POA: Diagnosis not present

## 2024-06-10 DIAGNOSIS — R053 Chronic cough: Secondary | ICD-10-CM | POA: Diagnosis not present

## 2024-06-10 LAB — COMPREHENSIVE METABOLIC PANEL WITH GFR
ALT: 13 U/L (ref 3–35)
AST: 18 U/L (ref 5–37)
Albumin: 4.6 g/dL (ref 3.5–5.2)
Alkaline Phosphatase: 77 U/L (ref 39–117)
BUN: 23 mg/dL (ref 6–23)
CO2: 30 meq/L (ref 19–32)
Calcium: 10 mg/dL (ref 8.4–10.5)
Chloride: 99 meq/L (ref 96–112)
Creatinine, Ser: 0.79 mg/dL (ref 0.40–1.20)
GFR: 83.54 mL/min
Glucose, Bld: 91 mg/dL (ref 70–99)
Potassium: 3.7 meq/L (ref 3.5–5.1)
Sodium: 137 meq/L (ref 135–145)
Total Bilirubin: 1.1 mg/dL (ref 0.2–1.2)
Total Protein: 8.2 g/dL (ref 6.0–8.3)

## 2024-06-10 LAB — MICROALBUMIN / CREATININE URINE RATIO
Creatinine,U: 152.7 mg/dL
Microalb Creat Ratio: 25.6 mg/g (ref 0.0–30.0)
Microalb, Ur: 3.9 mg/dL — ABNORMAL HIGH (ref 0.7–1.9)

## 2024-06-10 LAB — CBC
HCT: 41.8 % (ref 36.0–46.0)
Hemoglobin: 13.7 g/dL (ref 12.0–15.0)
MCHC: 32.8 g/dL (ref 30.0–36.0)
MCV: 91.3 fl (ref 78.0–100.0)
Platelets: 297 K/uL (ref 150.0–400.0)
RBC: 4.58 Mil/uL (ref 3.87–5.11)
RDW: 15.9 % — ABNORMAL HIGH (ref 11.5–15.5)
WBC: 4.5 K/uL (ref 4.0–10.5)

## 2024-06-10 LAB — LIPID PANEL
Cholesterol: 184 mg/dL (ref 28–200)
HDL: 91.3 mg/dL
LDL Cholesterol: 82 mg/dL (ref 10–99)
NonHDL: 92.62
Total CHOL/HDL Ratio: 2
Triglycerides: 51 mg/dL (ref 10.0–149.0)
VLDL: 10.2 mg/dL (ref 0.0–40.0)

## 2024-06-10 LAB — HEMOGLOBIN A1C: Hgb A1c MFr Bld: 5.9 % (ref 4.6–6.5)

## 2024-06-10 MED ORDER — AMLODIPINE BESYLATE 10 MG PO TABS: 10.0000 mg | ORAL_TABLET | Freq: Every day | ORAL | 3 refills | Status: AC | PRN

## 2024-06-10 MED ORDER — LOSARTAN POTASSIUM-HCTZ 100-25 MG PO TABS: 1.0000 | ORAL_TABLET | Freq: Every day | ORAL | 3 refills | Status: AC

## 2024-06-10 MED ORDER — BENZONATATE 200 MG PO CAPS: 200.0000 mg | ORAL_CAPSULE | Freq: Two times a day (BID) | ORAL | 0 refills | Status: AC | PRN

## 2024-06-10 MED ORDER — EMPAGLIFLOZIN 10 MG PO TABS: 10.0000 mg | ORAL_TABLET | Freq: Every day | ORAL | 3 refills | Status: AC

## 2024-06-10 NOTE — Assessment & Plan Note (Signed)
 Checking HgA1c, UACR, lipid panel, CMP. Adjust as needed metformin  and mounjaro . On ARB and statin.

## 2024-06-10 NOTE — Patient Instructions (Signed)
 Get the mammogram done.  We have given you the pneumonia vaccine today and the shingles vaccine. Come back in 2 months for the 2nd dose of the shingles vaccine.   We are sending you the cologuard for colon cancer screening so do that and return.

## 2024-06-10 NOTE — Assessment & Plan Note (Addendum)
 Flu shot declines. Pneumonia given 20. Shingrix  given 1st. Tetanus counseled. Cologuard ordered Mammogram schedule, pap smear up to date. Counseled about sun safety and mole surveillance. Counseled about the dangers of distracted driving. Given 10 year screening recommendations.

## 2024-06-10 NOTE — Progress Notes (Signed)
" ° °  Subjective:   Patient ID: Lori Levy, female    DOB: 09/11/1965, 59 y.o.   MRN: [id number]  The patient is here for physical. Pertinent topics discussed: Discussed the use of AI scribe software for clinical note transcription with the patient, who gave verbal consent to proceed.  History of Present Illness Lori Levy is a 59 year old female who presents for a routine follow-up visit.  She has no new symptoms since her last visit. No chest pain, tightness, pressure, heart racing, or breathing difficulties. She experiences ongoing drainage causing throat tickling and occasional coughing, for which she uses Tessalon  Perles as needed.  She has shoulder pain and believes it may be related to how she has been sleeping. No new numbness, burning, or tingling in her extremities. No headaches, migraines, vision, or hearing troubles, although she needs to wear her glasses more often for clarity.  Her current medications include hydrochlorothiazide  25 mg daily for hypertension and amlodipine  as needed. She also has hydroxyzine  50 mg, which she does not use. She has lost weight from a previous weight of 230 pounds to around 172 pounds, attributing this to slow and steady progress aided by exercise and the use of Mounjaro , with no side effects reported.  She denies smoking and has never smoked. She is single and engages in physical activities such as dancing, which she finds beneficial.  PMH, FMH, social history reviewed and updated  Review of Systems  Constitutional: Negative.   HENT: Negative.    Eyes: Negative.   Respiratory:  Negative for cough, chest tightness and shortness of breath.   Cardiovascular:  Negative for chest pain, palpitations and leg swelling.  Gastrointestinal:  Negative for abdominal distention, abdominal pain, constipation, diarrhea, nausea and vomiting.  Musculoskeletal: Negative.   Skin: Negative.   Neurological: Negative.   Psychiatric/Behavioral: Negative.       Objective:  Physical Exam Constitutional:      Appearance: She is well-developed.  HENT:     Head: Normocephalic and atraumatic.  Cardiovascular:     Rate and Rhythm: Normal rate and regular rhythm.  Pulmonary:     Effort: Pulmonary effort is normal. No respiratory distress.     Breath sounds: Normal breath sounds. No wheezing or rales.  Abdominal:     General: Bowel sounds are normal. There is no distension.     Palpations: Abdomen is soft.     Tenderness: There is no abdominal tenderness.  Musculoskeletal:     Cervical back: Normal range of motion.  Skin:    General: Skin is warm and dry.  Neurological:     Mental Status: She is alert and oriented to person, place, and time.     Coordination: Coordination normal.     Vitals:   06/10/24 1404  BP: 130/78  Pulse: 100  Temp: 98.6 F (37 C)  SpO2: 98%  Weight: 182 lb 9.6 oz (82.8 kg)  Height: 5' 5 (1.651 m)    Assessment & Plan:  Shingrix  and prevnar 20 given at visit "

## 2024-06-10 NOTE — Assessment & Plan Note (Signed)
 Checking lipid panel and adjust as needed.

## 2024-06-10 NOTE — Assessment & Plan Note (Signed)
Refill tessalon perles

## 2024-06-10 NOTE — Assessment & Plan Note (Signed)
 BP at goal on losartan /hydrochlorothiazide  and have sent in combination pill instead of two separate pills. She has on hand amlodipine  10 mg which she uses prn only. Checking CMP.

## 2024-06-11 ENCOUNTER — Ambulatory Visit: Payer: Self-pay | Admitting: Internal Medicine

## 2024-06-19 NOTE — Telephone Encounter (Signed)
 Message was received through MyChart.

## 2024-06-26 ENCOUNTER — Other Ambulatory Visit: Payer: Self-pay | Admitting: Internal Medicine
